# Patient Record
Sex: Female | Born: 1980 | Hispanic: Yes | Marital: Married | Smoking: Never smoker
Health system: Southern US, Community
[De-identification: ages and names within clinical notes are randomized; demographics above are authoritative.]

## PROBLEM LIST (undated history)

## (undated) ENCOUNTER — Inpatient Hospital Stay (HOSPITAL_COMMUNITY): Payer: Self-pay

## (undated) DIAGNOSIS — F410 Panic disorder [episodic paroxysmal anxiety] without agoraphobia: Secondary | ICD-10-CM

## (undated) DIAGNOSIS — F329 Major depressive disorder, single episode, unspecified: Secondary | ICD-10-CM

## (undated) DIAGNOSIS — F419 Anxiety disorder, unspecified: Secondary | ICD-10-CM

## (undated) DIAGNOSIS — I8390 Asymptomatic varicose veins of unspecified lower extremity: Secondary | ICD-10-CM

## (undated) DIAGNOSIS — F32A Depression, unspecified: Secondary | ICD-10-CM

## (undated) HISTORY — DX: Panic disorder (episodic paroxysmal anxiety): F41.0

## (undated) HISTORY — DX: Anxiety disorder, unspecified: F41.9

## (undated) HISTORY — DX: Depression, unspecified: F32.A

## (undated) HISTORY — DX: Major depressive disorder, single episode, unspecified: F32.9

## (undated) HISTORY — DX: Asymptomatic varicose veins of unspecified lower extremity: I83.90

---

## 2005-01-07 ENCOUNTER — Ambulatory Visit: Payer: Self-pay | Admitting: Family Medicine

## 2005-01-12 ENCOUNTER — Encounter: Admission: RE | Admit: 2005-01-12 | Discharge: 2005-01-12 | Payer: Self-pay | Admitting: Family Medicine

## 2005-01-27 ENCOUNTER — Ambulatory Visit: Payer: Self-pay | Admitting: Family Medicine

## 2007-04-26 ENCOUNTER — Encounter (INDEPENDENT_AMBULATORY_CARE_PROVIDER_SITE_OTHER): Payer: Self-pay | Admitting: Internal Medicine

## 2007-04-26 ENCOUNTER — Ambulatory Visit: Payer: Self-pay | Admitting: Family Medicine

## 2007-04-26 ENCOUNTER — Ambulatory Visit: Payer: Self-pay | Admitting: *Deleted

## 2007-04-26 LAB — CONVERTED CEMR LAB
ALT: 11 units/L (ref 0–35)
AST: 19 units/L (ref 0–37)
Albumin: 4.6 g/dL (ref 3.5–5.2)
Alkaline Phosphatase: 50 units/L (ref 39–117)
BUN: 10 mg/dL (ref 6–23)
Basophils Absolute: 0 10*3/uL (ref 0.0–0.1)
Basophils Relative: 0 % (ref 0–1)
CO2: 26 meq/L (ref 19–32)
Calcium: 9.8 mg/dL (ref 8.4–10.5)
Chloride: 103 meq/L (ref 96–112)
Cholesterol: 159 mg/dL (ref 0–200)
Creatinine, Ser: 0.47 mg/dL (ref 0.40–1.20)
Eosinophils Absolute: 0 10*3/uL (ref 0.0–0.7)
Eosinophils Relative: 1 % (ref 0–5)
Glucose, Bld: 79 mg/dL (ref 70–99)
HCT: 38.8 % (ref 36.0–46.0)
HDL: 45 mg/dL (ref >39)
Helicobacter Pylori Antibody-IgG: 3.5 — ABNORMAL HIGH
Hemoglobin: 13 g/dL (ref 12.0–15.0)
LDL Cholesterol: 94 mg/dL (ref 0–99)
Lymphocytes Relative: 39 % (ref 12–46)
Lymphs Abs: 1.7 10*3/uL (ref 0.7–4.0)
MCHC: 33.5 g/dL (ref 30.0–36.0)
MCV: 92.8 fL (ref 78.0–100.0)
Monocytes Absolute: 0.3 10*3/uL (ref 0.1–1.0)
Monocytes Relative: 7 % (ref 3–12)
Neutro Abs: 2.3 10*3/uL (ref 1.7–7.7)
Neutrophils Relative %: 53 % (ref 43–77)
Platelets: 280 10*3/uL (ref 150–400)
Potassium: 4.4 meq/L (ref 3.5–5.3)
RBC: 4.18 M/uL (ref 3.87–5.11)
RDW: 12.6 % (ref 11.5–15.5)
Sodium: 140 meq/L (ref 135–145)
Total Bilirubin: 0.4 mg/dL (ref 0.3–1.2)
Total CHOL/HDL Ratio: 3.5
Total Protein: 7.6 g/dL (ref 6.0–8.3)
Triglycerides: 102 mg/dL (ref <150)
VLDL: 20 mg/dL (ref 0–40)
WBC: 4.3 10*3/uL (ref 4.0–10.5)

## 2007-06-08 ENCOUNTER — Encounter: Payer: Self-pay | Admitting: Family Medicine

## 2007-06-08 ENCOUNTER — Ambulatory Visit: Payer: Self-pay | Admitting: Internal Medicine

## 2007-06-08 LAB — CONVERTED CEMR LAB
Chlamydia, DNA Probe: NEGATIVE
GC Probe Amp, Genital: NEGATIVE

## 2007-09-05 ENCOUNTER — Ambulatory Visit: Payer: Self-pay | Admitting: Internal Medicine

## 2008-09-04 ENCOUNTER — Inpatient Hospital Stay (HOSPITAL_COMMUNITY): Admission: AD | Admit: 2008-09-04 | Discharge: 2008-09-04 | Payer: Self-pay | Admitting: Obstetrics & Gynecology

## 2008-09-06 ENCOUNTER — Inpatient Hospital Stay (HOSPITAL_COMMUNITY): Admission: AD | Admit: 2008-09-06 | Discharge: 2008-09-06 | Payer: Self-pay | Admitting: Obstetrics & Gynecology

## 2009-01-23 ENCOUNTER — Ambulatory Visit: Payer: Self-pay | Admitting: Internal Medicine

## 2009-05-29 ENCOUNTER — Ambulatory Visit (HOSPITAL_COMMUNITY): Admission: RE | Admit: 2009-05-29 | Discharge: 2009-05-29 | Payer: Self-pay | Admitting: Family Medicine

## 2009-10-26 ENCOUNTER — Inpatient Hospital Stay (HOSPITAL_COMMUNITY): Admission: AD | Admit: 2009-10-26 | Discharge: 2009-10-28 | Payer: Self-pay | Admitting: Family Medicine

## 2009-10-26 ENCOUNTER — Ambulatory Visit: Payer: Self-pay | Admitting: Family Medicine

## 2010-03-23 ENCOUNTER — Encounter: Payer: Self-pay | Admitting: Family Medicine

## 2010-05-16 LAB — CBC
HCT: 30.4 % — ABNORMAL LOW (ref 36.0–46.0)
HCT: 36.2 % (ref 36.0–46.0)
Hemoglobin: 10.7 g/dL — ABNORMAL LOW (ref 12.0–15.0)
Hemoglobin: 12.7 g/dL (ref 12.0–15.0)
MCH: 34.2 pg — ABNORMAL HIGH (ref 26.0–34.0)
MCH: 34.5 pg — ABNORMAL HIGH (ref 26.0–34.0)
MCHC: 35 g/dL (ref 30.0–36.0)
MCHC: 35.2 g/dL (ref 30.0–36.0)
MCV: 97.6 fL (ref 78.0–100.0)
MCV: 98.2 fL (ref 78.0–100.0)
Platelets: 184 10*3/uL (ref 150–400)
Platelets: 240 10*3/uL (ref 150–400)
RBC: 3.09 MIL/uL — ABNORMAL LOW (ref 3.87–5.11)
RBC: 3.71 MIL/uL — ABNORMAL LOW (ref 3.87–5.11)
RDW: 13 % (ref 11.5–15.5)
RDW: 13 % (ref 11.5–15.5)
WBC: 6.9 10*3/uL (ref 4.0–10.5)
WBC: 7 10*3/uL (ref 4.0–10.5)

## 2010-05-16 LAB — RPR: RPR Ser Ql: NONREACTIVE

## 2010-06-08 LAB — CBC
HCT: 37.4 % (ref 36.0–46.0)
Hemoglobin: 13.2 g/dL (ref 12.0–15.0)
MCHC: 35.4 g/dL (ref 30.0–36.0)
MCV: 92.9 fL (ref 78.0–100.0)
Platelets: 206 10*3/uL (ref 150–400)
RBC: 4.02 MIL/uL (ref 3.87–5.11)
RDW: 12.5 % (ref 11.5–15.5)
WBC: 4.9 10*3/uL (ref 4.0–10.5)

## 2010-06-08 LAB — URINALYSIS, ROUTINE W REFLEX MICROSCOPIC
Bilirubin Urine: NEGATIVE
Glucose, UA: NEGATIVE mg/dL
Ketones, ur: NEGATIVE mg/dL
Leukocytes, UA: NEGATIVE
Nitrite: NEGATIVE
Protein, ur: NEGATIVE mg/dL
Specific Gravity, Urine: 1.01 (ref 1.005–1.030)
Urobilinogen, UA: 0.2 mg/dL (ref 0.0–1.0)
pH: 5.5 (ref 5.0–8.0)

## 2010-06-08 LAB — URINE MICROSCOPIC-ADD ON

## 2010-06-08 LAB — ABO/RH: ABO/RH(D): A POS

## 2010-06-08 LAB — HCG, QUANTITATIVE, PREGNANCY
hCG, Beta Chain, Quant, S: 195 m[IU]/mL — ABNORMAL HIGH (ref <5)
hCG, Beta Chain, Quant, S: 45 m[IU]/mL — ABNORMAL HIGH (ref <5)

## 2010-06-08 LAB — POCT PREGNANCY, URINE: Preg Test, Ur: POSITIVE

## 2012-01-18 LAB — OB RESULTS CONSOLE ABO/RH

## 2012-01-18 LAB — OB RESULTS CONSOLE HIV ANTIBODY (ROUTINE TESTING): HIV: NONREACTIVE

## 2012-01-18 LAB — OB RESULTS CONSOLE GC/CHLAMYDIA
Chlamydia: NEGATIVE
Gonorrhea: NEGATIVE

## 2012-01-18 LAB — OB RESULTS CONSOLE ANTIBODY SCREEN: Antibody Screen: NEGATIVE

## 2012-01-18 LAB — OB RESULTS CONSOLE HEPATITIS B SURFACE ANTIGEN: Hepatitis B Surface Ag: NEGATIVE

## 2012-01-18 LAB — OB RESULTS CONSOLE RUBELLA ANTIBODY, IGM: Rubella: IMMUNE

## 2012-01-18 LAB — OB RESULTS CONSOLE RPR: RPR: NONREACTIVE

## 2012-01-19 ENCOUNTER — Ambulatory Visit (HOSPITAL_COMMUNITY)
Admission: RE | Admit: 2012-01-19 | Discharge: 2012-01-19 | Disposition: A | Discharge status: Home / Self Care | Payer: Medicaid Other | Source: Ambulatory Visit | Attending: Nurse Practitioner | Admitting: Nurse Practitioner

## 2012-01-19 ENCOUNTER — Other Ambulatory Visit (HOSPITAL_COMMUNITY): Payer: Self-pay | Admitting: Nurse Practitioner

## 2012-01-19 DIAGNOSIS — Z363 Encounter for antenatal screening for malformations: Secondary | ICD-10-CM | POA: Insufficient documentation

## 2012-01-19 DIAGNOSIS — IMO0002 Reserved for concepts with insufficient information to code with codable children: Secondary | ICD-10-CM

## 2012-01-19 DIAGNOSIS — Z1389 Encounter for screening for other disorder: Secondary | ICD-10-CM | POA: Insufficient documentation

## 2012-01-19 DIAGNOSIS — O3660X0 Maternal care for excessive fetal growth, unspecified trimester, not applicable or unspecified: Secondary | ICD-10-CM | POA: Insufficient documentation

## 2012-01-19 DIAGNOSIS — O358XX0 Maternal care for other (suspected) fetal abnormality and damage, not applicable or unspecified: Secondary | ICD-10-CM | POA: Insufficient documentation

## 2012-03-02 NOTE — L&D Delivery Note (Signed)
Ziana Heyliger is a 32 y.o. [phone] admitted at [redacted]w[redacted]d for IOL for post-dates. She had 1 prior c/section and 2 successful VBACs previously. She was 3 cm on arrival and was started on pitocin. She progressed rapidly to complete dilation.   Delivery Note At 2:42 PM a viable female was delivered via Vaginal, Spontaneous Delivery (Presentation: vertex, OA).  APGAR: 9, 9; weight 8 lb, 8 oz. Placenta status: spontaneous, intact.  Cord: 3-vessel. Complications: none.  Cord pH: n/a  Anesthesia: Epidural  Episiotomy: none Lacerations: none Suture Repair: n/a Est. Blood Loss (mL): 200  Mom to postpartum.  Baby to nursery-stable.  Napoleon Form 06/19/2012, 2:53 PM

## 2012-05-13 LAB — OB RESULTS CONSOLE GBS: GBS: NEGATIVE

## 2012-06-13 ENCOUNTER — Other Ambulatory Visit (HOSPITAL_COMMUNITY): Payer: Self-pay | Admitting: Physician Assistant

## 2012-06-13 DIAGNOSIS — O480 Post-term pregnancy: Secondary | ICD-10-CM

## 2012-06-14 ENCOUNTER — Telehealth (HOSPITAL_COMMUNITY): Payer: Self-pay | Admitting: *Deleted

## 2012-06-14 ENCOUNTER — Encounter (HOSPITAL_COMMUNITY): Payer: Self-pay | Admitting: *Deleted

## 2012-06-14 NOTE — Telephone Encounter (Signed)
Preadmission screen  

## 2012-06-16 ENCOUNTER — Inpatient Hospital Stay (HOSPITAL_COMMUNITY)
Admission: AD | Admit: 2012-06-16 | Discharge: 2012-06-16 | Disposition: A | Discharge status: Home / Self Care | Payer: Self-pay | Source: Ambulatory Visit | Attending: Obstetrics & Gynecology | Admitting: Obstetrics & Gynecology

## 2012-06-16 ENCOUNTER — Ambulatory Visit (HOSPITAL_COMMUNITY)
Admission: RE | Admit: 2012-06-16 | Discharge: 2012-06-16 | Disposition: A | Discharge status: Home / Self Care | Payer: MEDICAID | Source: Ambulatory Visit | Attending: Physician Assistant | Admitting: Physician Assistant

## 2012-06-16 ENCOUNTER — Encounter (HOSPITAL_COMMUNITY): Payer: Self-pay | Admitting: *Deleted

## 2012-06-16 DIAGNOSIS — Z3689 Encounter for other specified antenatal screening: Secondary | ICD-10-CM | POA: Insufficient documentation

## 2012-06-16 DIAGNOSIS — O480 Post-term pregnancy: Secondary | ICD-10-CM

## 2012-06-16 DIAGNOSIS — O34219 Maternal care for unspecified type scar from previous cesarean delivery: Secondary | ICD-10-CM | POA: Insufficient documentation

## 2012-06-16 DIAGNOSIS — O479 False labor, unspecified: Secondary | ICD-10-CM

## 2012-06-16 LAB — AMNISURE RUPTURE OF MEMBRANE (ROM) NOT AT ARMC: Amnisure ROM: NEGATIVE

## 2012-06-16 NOTE — MAU Note (Signed)
C/o ucs yesterday and this morning;

## 2012-06-16 NOTE — MAU Note (Signed)
Scheduled for induction for this Sunday;

## 2012-06-16 NOTE — MAU Note (Signed)
Contractions past 2 wks, getting stronger, now every 30 min.  Lots of pressure. ? Water since Monday.

## 2012-06-16 NOTE — MAU Provider Note (Signed)
History     CSN: [id number]  Arrival date and time: 06/16/12 4098   None     Chief Complaint  Patient presents with  . Labor Eval   HPI Jackie Cooper  Is a 32 y.o. [phone] who presents at [redacted]w[redacted]d  with ctx and she states that she has had some light yellowish-brown leakage of fluid, no bleeding. Pt states she has been contracting every 30 mins or so. Pt has had a past c/s in 01/1999 in [place] which was her first delivery. Hx of 2 successful vbac deliveries, reports no complications. Last delivery in 2011. Pt states she [facility] for [facility] . Baby moving well.  Prenatal care at [facility]. Uncomplicated pregnancy. Induction scheduled for Sunday 06/19/12.   OB History   Grav Para Term Preterm Abortions TAB SAB Ect Mult Living   5 3 3  1  1   3       Past Medical History  Diagnosis Date  . Varicose veins   . Depression     pp depression    Past Surgical History  Procedure Laterality Date  . Cesarean section      Family History  Problem Relation Age of Onset  . Diabetes Maternal Grandmother     History  Substance Use Topics  . Smoking status: Never Smoker   . Smokeless tobacco: Never Used  . Alcohol Use: No    Allergies: No Known Allergies  No prescriptions prior to admission    Review of Systems  Constitutional: Negative for fever and chills.  HENT: Negative.   Eyes: Negative for blurred vision and double vision.  Respiratory: Negative.   Cardiovascular: Negative.   Gastrointestinal: Negative for nausea, vomiting, diarrhea and constipation.  Genitourinary: Positive for dysuria, urgency and frequency. Negative for hematuria.  Musculoskeletal: Negative for myalgias.  Neurological: Negative for dizziness and headaches.   Physical Exam   Blood pressure 113/69, pulse 74, temperature 98.3 F (36.8 C), temperature source Oral, resp. rate 18, height 5' 2.5" (1.588 m), weight 78.472 kg (173 lb).  Physical Exam   Constitutional: She is oriented to person, place, and time. She appears well-developed and well-nourished.  HENT:  Head: Normocephalic and atraumatic.  Eyes: EOM are normal.  Neck: Normal range of motion.  Cardiovascular: Normal rate, regular rhythm, normal heart sounds and intact distal pulses.   Respiratory: Effort normal and breath sounds normal. No respiratory distress. She has no wheezes. She exhibits no tenderness.  GI: Soft. She exhibits no distension and no mass. There is no tenderness. There is no rebound and no guarding.  Neurological: She is alert and oriented to person, place, and time.  Skin: Skin is warm and dry.   FHTs:  130, moderate variability, accels present, no decels. Reactive TOCO:  Occasional ctx   Results for orders placed during the hospital encounter of 06/16/12 (from the past 24 hour(s))  AMNISURE RUPTURE OF MEMBRANE (ROM)     Status: None   Collection Time    06/16/12 10:40 AM      Result Value Range   Amnisure ROM NEGATIVE       MAU Course  Procedures  MDM Slide for ferning - negative, cervical exam dilated 2cm and 60%, cervical membranes stripped   Assessment and Plan  A: Patient not in active labor, membranes stripped. Labor Check 2cm 50% effaced -3 station. Negative fern. Amnisure negative.  Reassuring NST  P: discharge home. Return if contractions worsen, if there is leakage or gush  of fluid, or bleeding. Scheduled induction for Sunday 06/19/12  Rosalee Kaufman 06/16/2012, 9:15 AM   I saw and examined patient and agree with above student note. I reviewed history, imaging, labs, and vitals. I personally reviewed the fetal heart tracing, and it is reactive. Napoleon Form, MD

## 2012-06-18 NOTE — MAU Provider Note (Signed)
Attestation of Attending Supervision of Obstetric Fellow: Evaluation and management procedures were performed by the Obstetric Fellow under my supervision and collaboration.  I have reviewed the Obstetric Fellow's note and chart, and I agree with the management and plan.  Kennethia Lynes, MD, FACOG Attending Obstetrician & Gynecologist Faculty Practice, [facility]   

## 2012-06-19 ENCOUNTER — Inpatient Hospital Stay (HOSPITAL_COMMUNITY)
Admission: RE | Admit: 2012-06-19 | Discharge: 2012-06-20 | DRG: 775 | Disposition: A | Discharge status: Home / Self Care | Payer: Medicaid Other | Source: Ambulatory Visit | Attending: Family Medicine | Admitting: Family Medicine

## 2012-06-19 ENCOUNTER — Encounter (HOSPITAL_COMMUNITY): Payer: Self-pay | Admitting: Anesthesiology

## 2012-06-19 ENCOUNTER — Encounter (HOSPITAL_COMMUNITY): Payer: Self-pay

## 2012-06-19 ENCOUNTER — Inpatient Hospital Stay (HOSPITAL_COMMUNITY): Payer: Medicaid Other | Admitting: Anesthesiology

## 2012-06-19 VITALS — BP 86/52 | HR 78 | Temp 98.2°F | Resp 16 | Ht 62.0 in | Wt 173.0 lb

## 2012-06-19 DIAGNOSIS — Z3490 Encounter for supervision of normal pregnancy, unspecified, unspecified trimester: Secondary | ICD-10-CM

## 2012-06-19 DIAGNOSIS — Z3493 Encounter for supervision of normal pregnancy, unspecified, third trimester: Secondary | ICD-10-CM

## 2012-06-19 DIAGNOSIS — O34219 Maternal care for unspecified type scar from previous cesarean delivery: Secondary | ICD-10-CM

## 2012-06-19 DIAGNOSIS — O480 Post-term pregnancy: Principal | ICD-10-CM | POA: Diagnosis present

## 2012-06-19 LAB — CBC
Hemoglobin: 12.4 g/dL (ref 12.0–15.0)
MCHC: 34.7 g/dL (ref 30.0–36.0)
Platelets: 214 10*3/uL (ref 150–400)
RBC: 3.74 MIL/uL — ABNORMAL LOW (ref 3.87–5.11)

## 2012-06-19 LAB — TYPE AND SCREEN: ABO/RH(D): A POS

## 2012-06-19 LAB — RPR: RPR Ser Ql: NONREACTIVE

## 2012-06-19 LAB — ABO/RH: ABO/RH(D): A POS

## 2012-06-19 MED ORDER — LIDOCAINE HCL (PF) 1 % IJ SOLN
30.0000 mL | INTRAMUSCULAR | Status: DC | PRN
  Filled 2012-06-19 (×2): qty 30

## 2012-06-19 MED ORDER — MEASLES, MUMPS & RUBELLA VAC ~~LOC~~ INJ
0.5000 mL | INJECTION | Freq: Once | SUBCUTANEOUS | Status: DC
  Filled 2012-06-19: qty 0.5

## 2012-06-19 MED ORDER — FENTANYL 2.5 MCG/ML BUPIVACAINE 1/10 % EPIDURAL INFUSION (WH - ANES)
14.0000 mL/h | INTRAMUSCULAR | Status: DC | PRN
  Filled 2012-06-19: qty 125

## 2012-06-19 MED ORDER — LACTATED RINGERS IV SOLN
500.0000 mL | INTRAVENOUS | Status: DC | PRN
  Administered 2012-06-19: 1000 mL via INTRAVENOUS

## 2012-06-19 MED ORDER — ONDANSETRON HCL 4 MG/2ML IJ SOLN: 4.0000 mg | Freq: Four times a day (QID) | INTRAMUSCULAR | Status: DC | PRN

## 2012-06-19 MED ORDER — OXYCODONE-ACETAMINOPHEN 5-325 MG PO TABS: 1.0000 | ORAL_TABLET | ORAL | Status: DC | PRN

## 2012-06-19 MED ORDER — ACETAMINOPHEN 325 MG PO TABS: 650.0000 mg | ORAL_TABLET | ORAL | Status: DC | PRN

## 2012-06-19 MED ORDER — FERROUS SULFATE 325 (65 FE) MG PO TABS
325.0000 mg | ORAL_TABLET | Freq: Two times a day (BID) | ORAL | Status: DC
  Administered 2012-06-20: 325 mg via ORAL
  Filled 2012-06-19: qty 1

## 2012-06-19 MED ORDER — DIBUCAINE 1 % RE OINT: 1.0000 "application " | TOPICAL_OINTMENT | RECTAL | Status: DC | PRN

## 2012-06-19 MED ORDER — OXYTOCIN 40 UNITS IN LACTATED RINGERS INFUSION - SIMPLE MED: 62.5000 mL/h | INTRAVENOUS | Status: DC

## 2012-06-19 MED ORDER — LACTATED RINGERS IV SOLN: 500.0000 mL | Freq: Once | INTRAVENOUS | Status: DC

## 2012-06-19 MED ORDER — DIPHENHYDRAMINE HCL 25 MG PO CAPS: 25.0000 mg | ORAL_CAPSULE | Freq: Four times a day (QID) | ORAL | Status: DC | PRN

## 2012-06-19 MED ORDER — SENNOSIDES-DOCUSATE SODIUM 8.6-50 MG PO TABS
2.0000 | ORAL_TABLET | Freq: Every day | ORAL | Status: DC
  Administered 2012-06-19: 2 via ORAL

## 2012-06-19 MED ORDER — LIDOCAINE HCL (PF) 1 % IJ SOLN
INTRAMUSCULAR | Status: DC | PRN
  Administered 2012-06-19 (×2): 5 mL

## 2012-06-19 MED ORDER — BISACODYL 10 MG RE SUPP: 10.0000 mg | Freq: Every day | RECTAL | Status: DC | PRN

## 2012-06-19 MED ORDER — OXYTOCIN 40 UNITS IN LACTATED RINGERS INFUSION - SIMPLE MED
INTRAVENOUS | Status: AC
  Filled 2012-06-19: qty 1000

## 2012-06-19 MED ORDER — TERBUTALINE SULFATE 1 MG/ML IJ SOLN: 0.2500 mg | Freq: Once | INTRAMUSCULAR | Status: DC | PRN

## 2012-06-19 MED ORDER — ONDANSETRON HCL 4 MG/2ML IJ SOLN: 4.0000 mg | INTRAMUSCULAR | Status: DC | PRN

## 2012-06-19 MED ORDER — IBUPROFEN 600 MG PO TABS
600.0000 mg | ORAL_TABLET | Freq: Four times a day (QID) | ORAL | Status: DC | PRN
  Administered 2012-06-19: 600 mg via ORAL
  Filled 2012-06-19: qty 1

## 2012-06-19 MED ORDER — WITCH HAZEL-GLYCERIN EX PADS: 1.0000 "application " | MEDICATED_PAD | CUTANEOUS | Status: DC | PRN

## 2012-06-19 MED ORDER — EPHEDRINE 5 MG/ML INJ
10.0000 mg | INTRAVENOUS | Status: DC | PRN
  Filled 2012-06-19: qty 4
  Filled 2012-06-19: qty 2

## 2012-06-19 MED ORDER — FLEET ENEMA 7-19 GM/118ML RE ENEM: 1.0000 | ENEMA | Freq: Every day | RECTAL | Status: DC | PRN

## 2012-06-19 MED ORDER — SIMETHICONE 80 MG PO CHEW: 80.0000 mg | CHEWABLE_TABLET | ORAL | Status: DC | PRN

## 2012-06-19 MED ORDER — BENZOCAINE-MENTHOL 20-0.5 % EX AERO: 1.0000 "application " | INHALATION_SPRAY | CUTANEOUS | Status: DC | PRN

## 2012-06-19 MED ORDER — OXYCODONE-ACETAMINOPHEN 5-325 MG PO TABS
1.0000 | ORAL_TABLET | ORAL | Status: DC | PRN
  Administered 2012-06-19 – 2012-06-20 (×4): 1 via ORAL
  Filled 2012-06-19 (×4): qty 1

## 2012-06-19 MED ORDER — PHENYLEPHRINE 40 MCG/ML (10ML) SYRINGE FOR IV PUSH (FOR BLOOD PRESSURE SUPPORT)
80.0000 ug | PREFILLED_SYRINGE | INTRAVENOUS | Status: DC | PRN
  Filled 2012-06-19: qty 5
  Filled 2012-06-19: qty 2

## 2012-06-19 MED ORDER — DIPHENHYDRAMINE HCL 50 MG/ML IJ SOLN: 12.5000 mg | INTRAMUSCULAR | Status: DC | PRN

## 2012-06-19 MED ORDER — IBUPROFEN 600 MG PO TABS
600.0000 mg | ORAL_TABLET | Freq: Four times a day (QID) | ORAL | Status: DC
  Administered 2012-06-20 (×3): 600 mg via ORAL
  Filled 2012-06-19 (×3): qty 1

## 2012-06-19 MED ORDER — FENTANYL 2.5 MCG/ML BUPIVACAINE 1/10 % EPIDURAL INFUSION (WH - ANES)
INTRAMUSCULAR | Status: DC | PRN
  Administered 2012-06-19: 14 mL/h via EPIDURAL

## 2012-06-19 MED ORDER — EPHEDRINE 5 MG/ML INJ
10.0000 mg | INTRAVENOUS | Status: DC | PRN
  Filled 2012-06-19: qty 2

## 2012-06-19 MED ORDER — OXYTOCIN 40 UNITS IN LACTATED RINGERS INFUSION - SIMPLE MED
1.0000 m[IU]/min | INTRAVENOUS | Status: DC
  Administered 2012-06-19: 2 m[IU]/min via INTRAVENOUS

## 2012-06-19 MED ORDER — OXYTOCIN BOLUS FROM INFUSION: 500.0000 mL | INTRAVENOUS | Status: DC

## 2012-06-19 MED ORDER — TETANUS-DIPHTH-ACELL PERTUSSIS 5-2.5-18.5 LF-MCG/0.5 IM SUSP: 0.5000 mL | Freq: Once | INTRAMUSCULAR | Status: DC

## 2012-06-19 MED ORDER — LANOLIN HYDROUS EX OINT: TOPICAL_OINTMENT | CUTANEOUS | Status: DC | PRN

## 2012-06-19 MED ORDER — OXYTOCIN 40 UNITS IN LACTATED RINGERS INFUSION - SIMPLE MED: 62.5000 mL/h | INTRAVENOUS | Status: DC | PRN

## 2012-06-19 MED ORDER — FLEET ENEMA 7-19 GM/118ML RE ENEM: 1.0000 | ENEMA | RECTAL | Status: DC | PRN

## 2012-06-19 MED ORDER — LACTATED RINGERS IV SOLN
INTRAVENOUS | Status: DC
  Administered 2012-06-19: 1000 mL via INTRAVENOUS

## 2012-06-19 MED ORDER — ONDANSETRON HCL 4 MG PO TABS: 4.0000 mg | ORAL_TABLET | ORAL | Status: DC | PRN

## 2012-06-19 MED ORDER — PRENATAL MULTIVITAMIN CH
1.0000 | ORAL_TABLET | Freq: Every day | ORAL | Status: DC
  Administered 2012-06-20: 1 via ORAL
  Filled 2012-06-19: qty 1

## 2012-06-19 MED ORDER — CITRIC ACID-SODIUM CITRATE 334-500 MG/5ML PO SOLN: 30.0000 mL | ORAL | Status: DC | PRN

## 2012-06-19 MED ORDER — PHENYLEPHRINE 40 MCG/ML (10ML) SYRINGE FOR IV PUSH (FOR BLOOD PRESSURE SUPPORT)
80.0000 ug | PREFILLED_SYRINGE | INTRAVENOUS | Status: DC | PRN
  Filled 2012-06-19: qty 2

## 2012-06-19 MED ORDER — ZOLPIDEM TARTRATE 5 MG PO TABS: 5.0000 mg | ORAL_TABLET | Freq: Every evening | ORAL | Status: DC | PRN

## 2012-06-19 NOTE — Anesthesia Procedure Notes (Signed)
Epidural Patient location during procedure: OB  Staffing Anesthesiologist: Sherman Donaldson Performed by: anesthesiologist   Preanesthetic Checklist Completed: patient identified, site marked, surgical consent, pre-op evaluation, timeout performed, IV checked, risks and benefits discussed and monitors and equipment checked  Epidural Patient position: sitting Prep: ChloraPrep Patient monitoring: heart rate, continuous pulse ox and blood pressure Approach: midline Injection technique: LOR saline  Needle:  Needle type: Tuohy  Needle gauge: 17 G Needle length: 9 cm and 9 Needle insertion depth: 7 cm Catheter type: closed end flexible Catheter size: 20 Guage Catheter at skin depth: 12 cm Test dose: negative  Assessment Events: blood not aspirated, injection not painful, no injection resistance, negative IV test and no paresthesia  Additional Notes   Patient tolerated the insertion well without complications.   

## 2012-06-19 NOTE — Progress Notes (Signed)
Patient ID: Jackie Cooper, female   DOB: 06/06/1980, 32 y.o.   MRN: [id number]  S: Pt somewhat uncomfortable.  OCeasar Mons Vitals:   06/19/12 0918 06/19/12 1003 06/19/12 1101 06/19/12 1155  BP: 111/62 108/65  118/65  Pulse: 83 72  61  Temp:   97.9 F (36.6 C)   TempSrc:   Oral   Resp:   18   Height:      Weight:        Cervix: 3.5/80/-2  FHTs:  Was 135 with moderate-marked variability and accels. Had decels x 1-2 minutes to 60s with poor tracing when on left and right side. Now baseline 150, moderate variability, variable decel to 100s for 1 min while being checked, accels present  FSE placed, no fluid noted.  TOCO: q 3-4 minutes  A/P 32 y.o. [id number] at [redacted]w[redacted]d here for IOL for post-dates. - small change in dilation - FSE placed, FHT cat II but overall reassuring - Continue pitocin - Anticipate successful VBAC  Napoleon Form, MD

## 2012-06-19 NOTE — Anesthesia Preprocedure Evaluation (Signed)

## 2012-06-19 NOTE — H&P (Signed)
Jackie Cooper is a 32 y.o. female presenting for IOL for post-dates. Patient has been contracting some at home, mostly mild, no ROM or vaginal bleeding. Baby moving well.  Receives care at [facility]. No complications with this pregnancy. Had c-section with first baby in [place], not sure why. Has had two successful VBACs with no complications. Plans to breastfeed and get Mirena IUD for contraception.  Maternal Medical History:  Reason for admission: Nausea.    OB History   Grav Para Term Preterm Abortions TAB SAB Ect Mult Living   5 3 3  1  1   3      Past Medical History  Diagnosis Date  . Varicose veins   . Depression     pp depression   Past Surgical History  Procedure Laterality Date  . Cesarean section     Family History: family history includes Diabetes in her maternal grandmother. Social History:  reports that she has never smoked. She has never used smokeless tobacco. She reports that she does not drink alcohol or use illicit drugs.   Prenatal Transfer Tool  Maternal Diabetes: No Genetic Screening: Normal Maternal Ultrasounds/Referrals: Normal Fetal Ultrasounds or other Referrals:  None Maternal Substance Abuse:  No Significant Maternal Medications:  None Significant Maternal Lab Results:  Lab values include: Group B Strep negative Other Comments:  None  Review of Systems  Constitutional: Negative for fever and chills.  Eyes: Negative for blurred vision and double vision.  Gastrointestinal: Negative for nausea, vomiting and diarrhea.  Neurological: Negative for dizziness and headaches.    Dilation: 3 Effacement (%): 80 Station: 0;-1 Exam by:: Dherr rn Blood pressure 111/62, pulse 83, temperature 97.8 F (36.6 C), temperature source Oral, resp. rate 18, height 5\' 2"  (1.575 m), weight 78.472 kg (173 lb). Maternal Exam:  Uterine Assessment: Contraction strength is mild.  Contraction frequency is irregular.   Abdomen: Surgical  scars: low vertical midline.   Fetal presentation: vertex  Pelvis: adequate for delivery.   Cervix: Cervix evaluated by digital exam.     Fetal Exam Fetal Monitor Review: Mode: ultrasound.   Baseline rate: 135.  Variability: moderate (6-25 bpm).   Pattern: accelerations present and no decelerations.    Fetal State Assessment: Category I - tracings are normal.     Physical Exam  Constitutional: She is oriented to person, place, and time. She appears well-developed and well-nourished. No distress.  HENT:  Head: Normocephalic and atraumatic.  Eyes: Conjunctivae and EOM are normal.  Neck: Normal range of motion. Neck supple.  Cardiovascular: Normal rate.   Respiratory: Effort normal. No respiratory distress.  GI: Soft. There is no tenderness. There is no rebound and no guarding.  Musculoskeletal: Normal range of motion. She exhibits no edema and no tenderness.  Neurological: She is alert and oriented to person, place, and time.  Skin: Skin is warm and dry.  Psychiatric: She has a normal mood and affect.    Filed Vitals:   06/19/12 0829 06/19/12 0847 06/19/12 0918  BP: 119/70  111/62  Pulse: 72  83  Temp: 97.8 F (36.6 C)    TempSrc: Oral    Resp: 18    Height:  5\' 2"  (1.575 m)   Weight:  78.472 kg (173 lb)      Dilation: 3 Effacement (%): 80 Cervical Position: Middle Station: 0;-1 Presentation: Vertex Exam by:: Dherr rn   Prenatal labs: ABO, Rh: A/Positive/-- (11/18 0000) Antibody: Negative (11/18 0000) Rubella: Immune (11/18 0000) RPR: Nonreactive (11/18 0000)  HBsAg: Negative (11/18 0000)  HIV: Non-reactive (11/18 0000)  GBS: Negative (03/14 0000)   Assessment/Plan: 32 y.o. [id number] at [redacted]w[redacted]d here for IOL for post-dates - FHTs category I - Prior c-section, 2 prior VBACs - Pitocin, GBS negative - Epidural when ready - Anticipate SVD  Napoleon Form 06/19/2012, 10:30 AM

## 2012-06-20 ENCOUNTER — Encounter: Payer: Self-pay | Admitting: Sports Medicine

## 2012-06-20 ENCOUNTER — Encounter (HOSPITAL_COMMUNITY): Payer: Self-pay

## 2012-06-20 DIAGNOSIS — Z3490 Encounter for supervision of normal pregnancy, unspecified, unspecified trimester: Secondary | ICD-10-CM

## 2012-06-20 DIAGNOSIS — O34219 Maternal care for unspecified type scar from previous cesarean delivery: Secondary | ICD-10-CM

## 2012-06-20 MED ORDER — OXYCODONE-ACETAMINOPHEN 5-325 MG PO TABS: 1.0000 | ORAL_TABLET | Freq: Four times a day (QID) | ORAL | Status: DC | PRN

## 2012-06-20 MED ORDER — IBUPROFEN 600 MG PO TABS: 600.0000 mg | ORAL_TABLET | Freq: Four times a day (QID) | ORAL | Status: DC | PRN

## 2012-06-20 NOTE — Progress Notes (Signed)
Ur chart review completed.  

## 2012-06-20 NOTE — Progress Notes (Signed)
I saw and examined patient and agree with above resident note. I reviewed history, delivery summary, labs and vitals. Napoleon Form, MD

## 2012-06-20 NOTE — Discharge Summary (Signed)
Obstetric Discharge Summary Jackie Cooper is a 32 y.o. [id number] presenting at [redacted]w[redacted]d for IOL for post-dates. She had a successful VBAC with no complication and unremarkable postpartum course. She is breast and bottle feeding and plans Mirena for contraception.  Reason for Admission: induction of labor Prenatal Procedures: none Intrapartum Procedures: spontaneous vaginal delivery, VBAC Postpartum Procedures: none Complications-Operative and Postpartum: none Hemoglobin  Date Value Range Status  06/19/2012 12.4  12.0 - 15.0 g/dL Final     HCT  Date Value Range Status  06/19/2012 35.7* 36.0 - 46.0 % Final   Hospital Course: Jackie Cooper is a 32 y.o. [id number] admitted at [redacted]w[redacted]d for IOL for post-dates. She had 1 prior c/section and 2 successful VBACs previously. She was 3 cm on arrival and was started on pitocin. She progressed rapidly to complete dilation and had a normal vaginal delivery and uneventful postpartum course.  Physical Exam:  General: alert, cooperative and no distress Lochia: appropriate Uterine Fundus: firm Incision: n/a DVT Evaluation: No evidence of DVT seen on physical exam. Negative Homan's sign. No cords or calf tenderness.  Discharge Diagnoses: Term Pregnancy-delivered  Discharge Information: Date: 06/20/2012 Activity: pelvic rest Diet: routine Medications: PNV, Ibuprofen and tylenol Condition: stable Instructions: refer to practice specific booklet Discharge to: home  Follow-up Information   Follow up with [facility] [place]. Schedule an appointment as soon as possible for a visit in 6 weeks.   Contact information:   [address] [place] [address] [phone]      Newborn Data: Live born female  Birth Weight: 8 lb 8 oz (3855 g) APGAR: 9,   Home with mother.  Levert Feinstein 06/20/2012, 7:57 AM  I saw and examined patient and agree with above resident note. I reviewed history, delivery  summary, labs and vitals. Napoleon Form, MD

## 2012-06-20 NOTE — Discharge Summary (Signed)
Attestation of Attending Supervision of Advanced Practitioner (CNM/NP): Evaluation and management procedures were performed by the Advanced Practitioner under my supervision and collaboration.  I have reviewed the Advanced Practitioner's note and chart, and I agree with the management and plan.  Khylee Algeo 06/20/2012 7:36 PM   

## 2012-06-20 NOTE — Anesthesia Postprocedure Evaluation (Signed)
  Anesthesia Post-op Note  Patient: Jackie Cooper  Procedure(s) Performed: * No procedures listed *  Patient Location: PACU and Mother/Baby  Anesthesia Type:Epidural  Level of Consciousness: awake, alert , oriented and patient cooperative  Airway and Oxygen Therapy: Patient Spontanous Breathing  Post-op Pain: mild  Post-op Assessment: Patient's Cardiovascular Status Stable and Respiratory Function Stable  Post-op Vital Signs: stable  Complications: No apparent anesthesia complications

## 2012-06-20 NOTE — Clinical Social Work Maternal (Signed)
    Clinical Social Work Department PSYCHOSOCIAL ASSESSMENT - MATERNAL/CHILD 06/20/2012  Patient:  Jackie Cooper, Jackie Cooper  Account Number:  [id number]  Admit Date:  06/19/2012  Marjo Bicker Name:   Jackie Cooper    Clinical Social Worker:  Nobie Putnam, LCSW   Date/Time:  06/20/2012 12:41 PM  Date Referred:  06/20/2012   Referral source  CN     Referred reason  Behavioral Health Issues   Other referral source:    I:  FAMILY / HOME ENVIRONMENT Child's legal guardian:  PARENT  Guardian - Name Guardian - Age Guardian - Address  Jackie Cooper [address] [place], [place] [address]  Jackie Cooper 35 (same as above)   Other household support members/support persons Name Relationship DOB   DAUGHTER 01/14/99   SON 11/06/00   DAUGHTER 10/26/09   Other support:    II  PSYCHOSOCIAL DATA Information Source:  Patient Interview  Event organiser Employment:   Financial resources:  Self Pay If Medicaid - County:   Other  WIC   School / Grade:   Maternity Care Coordinator / Child Services Coordination / Early Interventions:  Cultural issues impacting care:    III  STRENGTHS Strengths  Adequate Resources  Home prepared for Child (including basic supplies)  Supportive family/friends   Strength comment:    IV  RISK FACTORS AND CURRENT PROBLEMS Current Problem:  YES   Risk Factor & Current Problem Patient Issue Family Issue Risk Factor / Current Problem Comment  Mental Illness Y N Hx of PP depression    V  SOCIAL WORK ASSESSMENT CSW met with pt to discuss PP depression symptoms she experienced in 2002.  Pt explained that she had recently [place] to the [place] & was left alone a lot during that time.  Pt told CSW that she avoided going into her kitchen because she envisioned herself getting a knife and cutting herself.  Pt never discussed symptoms with anyone because she didn't want anyone to think she was "crazy."  Her  symptoms lasted for about 1 month before they resolved.  She denies any history of HI.  She only experienced PP depression after the birth of her 2nd child & thinks it was influenced by her recent move to the country.  She denies any depression symptoms since then.  Her spouse is supportive, as per pt.  She agrees to seek medical attention if PP depression symptoms arise again.  Pt was very pleasant & spoke openly with this CSW. She has all the necessary supplies & appears appropriate at this time.      VI SOCIAL WORK PLAN Social Work Plan  No Further Intervention Required / No Barriers to Discharge   Type of pt/family education:   If child protective services report - county:   If child protective services report - date:   Information/referral to community resources comment:   Other social work plan:

## 2012-06-20 NOTE — Progress Notes (Addendum)
Post Partum Day 1 Subjective: up ad lib, voiding, tolerating PO and + flatus  Objective: Blood pressure 86/52, pulse 78, temperature 98.2 F (36.8 C), temperature source Oral, resp. rate 16, height 5\' 2"  (1.575 m), weight 173 lb (78.472 kg), SpO2 98.00%.  Physical Exam:  General: alert, cooperative and no distress Lochia: appropriate Uterine Fundus: firm Incision: n/a DVT Evaluation: No evidence of DVT seen on physical exam. Negative Homan's sign. No cords or calf tenderness.   Recent Labs  06/19/12 0830  HGB 12.4  HCT 35.7*    Assessment/Plan: Plan for discharge tomorrow Breastfeeding & bottle feeding Plans for mirena for contraception   LOS: 1 day   Levert Feinstein 06/20/2012, 7:43 AM

## 2012-06-26 ENCOUNTER — Encounter (HOSPITAL_COMMUNITY): Payer: Self-pay | Admitting: Advanced Practice Midwife

## 2012-06-26 ENCOUNTER — Inpatient Hospital Stay (HOSPITAL_COMMUNITY)
Admission: AD | Admit: 2012-06-26 | Discharge: 2012-06-26 | Disposition: A | Discharge status: Home / Self Care | Payer: MEDICAID | Source: Ambulatory Visit | Attending: Obstetrics & Gynecology | Admitting: Obstetrics & Gynecology

## 2012-06-26 DIAGNOSIS — N390 Urinary tract infection, site not specified: Secondary | ICD-10-CM | POA: Insufficient documentation

## 2012-06-26 DIAGNOSIS — O2390 Unspecified genitourinary tract infection in pregnancy, unspecified trimester: Secondary | ICD-10-CM | POA: Insufficient documentation

## 2012-06-26 DIAGNOSIS — O864 Pyrexia of unknown origin following delivery: Secondary | ICD-10-CM | POA: Insufficient documentation

## 2012-06-26 LAB — URINALYSIS, ROUTINE W REFLEX MICROSCOPIC
Bilirubin Urine: NEGATIVE
Ketones, ur: NEGATIVE mg/dL
Nitrite: POSITIVE — AB
Protein, ur: 30 mg/dL — AB
Urobilinogen, UA: 0.2 mg/dL (ref 0.0–1.0)
pH: 6.5 (ref 5.0–8.0)

## 2012-06-26 LAB — CBC
Hemoglobin: 12.1 g/dL (ref 12.0–15.0)
MCH: 33.1 pg (ref 26.0–34.0)
MCHC: 34.7 g/dL (ref 30.0–36.0)
Platelets: 204 10*3/uL (ref 150–400)
RDW: 12.1 % (ref 11.5–15.5)

## 2012-06-26 LAB — URINE MICROSCOPIC-ADD ON

## 2012-06-26 MED ORDER — SULFAMETHOXAZOLE-TMP DS 800-160 MG PO TABS: 1.0000 | ORAL_TABLET | Freq: Two times a day (BID) | ORAL | Status: DC

## 2012-06-26 MED ORDER — CEFTRIAXONE SODIUM 1 G IJ SOLR
1.0000 g | Freq: Once | INTRAMUSCULAR | Status: AC
  Administered 2012-06-26: 1 g via INTRAMUSCULAR
  Filled 2012-06-26: qty 10

## 2012-06-26 MED ORDER — IBUPROFEN 800 MG PO TABS
800.0000 mg | ORAL_TABLET | Freq: Once | ORAL | Status: AC
  Administered 2012-06-26: 800 mg via ORAL
  Filled 2012-06-26: qty 1

## 2012-06-26 NOTE — MAU Note (Signed)
Pt presents with complaints of fever since yesterday. Delivered vaginally on April the 20th.

## 2012-06-28 LAB — URINE CULTURE: Colony Count: >=100000

## 2012-06-30 ENCOUNTER — Encounter (HOSPITAL_COMMUNITY): Payer: Self-pay

## 2013-06-22 ENCOUNTER — Inpatient Hospital Stay (HOSPITAL_COMMUNITY)
Admission: AD | Admit: 2013-06-22 | Discharge: 2013-06-23 | Disposition: A | Discharge status: Home / Self Care | Payer: Medicaid Other | Source: Ambulatory Visit | Attending: Obstetrics & Gynecology | Admitting: Obstetrics & Gynecology

## 2013-06-22 DIAGNOSIS — K8020 Calculus of gallbladder without cholecystitis without obstruction: Secondary | ICD-10-CM | POA: Insufficient documentation

## 2013-06-22 DIAGNOSIS — R109 Unspecified abdominal pain: Secondary | ICD-10-CM | POA: Insufficient documentation

## 2013-06-22 LAB — URINALYSIS, ROUTINE W REFLEX MICROSCOPIC
BILIRUBIN URINE: NEGATIVE
Glucose, UA: NEGATIVE mg/dL
Ketones, ur: 15 mg/dL — AB
Leukocytes, UA: NEGATIVE
NITRITE: NEGATIVE
PH: 7.5 (ref 5.0–8.0)
Protein, ur: NEGATIVE mg/dL
SPECIFIC GRAVITY, URINE: 1.015 (ref 1.005–1.030)
Urobilinogen, UA: 1 mg/dL (ref 0.0–1.0)

## 2013-06-22 LAB — URINE MICROSCOPIC-ADD ON

## 2013-06-22 LAB — POCT PREGNANCY, URINE: Preg Test, Ur: NEGATIVE

## 2013-06-22 NOTE — MAU Note (Signed)
Pt reports pain in upper abd, feet and hands get numb when she has the pain. Pain also makes her feel like she can't breathe.

## 2013-06-22 NOTE — MAU Provider Note (Signed)
Chief Complaint: Abdominal Pain   First Provider Initiated Contact with Patient 06/23/13 0031      SUBJECTIVE HPI: Jackie Cooper is a 33 y.o. [phone] female who presents w/ a third episode of severe upper abd pain, nausea. Pain was so severe that it made it difficult to breathe. Developed numbness in hands feet and lips, resolved.  First two episodes occurred in pregnancy. Did not seek medical care. No Dx. Episode today 10/10 on pina scale. Improved w/ Ibuprofen and Pepcid.    Past Medical History  Diagnosis Date  . Varicose veins   . Depression     pp depression   OB History  Gravida Para Term Preterm AB SAB TAB Ectopic Multiple Living  5 4 4  1 1    4     # Outcome Date GA Lbr Len/2nd Weight Sex Delivery Anes PTL Lv  5 TRM 06/19/12 [redacted]w[redacted]d 05:10 / 00:12 3.856 kg (8 lb 8 oz) F VBAC EPI  Y  4 TRM 2011 [redacted]w[redacted]d 07:00 4.139 kg (9 lb 2 oz) F SVD EPI  Y  3 SAB 2010 [redacted]w[redacted]d         2 TRM 2002 [redacted]w[redacted]d 36:00 3.09 kg (6 lb 13 oz) M SVD EPI  Y     Comments: oligo pp dep no meds  1 TRM 2000 [redacted]w[redacted]d 03:00 3.742 kg (8 lb 4 oz) F LTCS   Y     Past Surgical History  Procedure Laterality Date  . Cesarean section     History   Social History  . Marital Status: Married    Spouse Name: N/A    Number of Children: N/A  . Years of Education: N/A   Occupational History  . Not on file.   Social History Main Topics  . Smoking status: Never Smoker   . Smokeless tobacco: Never Used  . Alcohol Use: No  . Drug Use: No  . Sexual Activity: Yes    Birth Control/ Protection: IUD   Other Topics Concern  . Not on file   Social History Narrative  . No narrative on file   No current facility-administered medications on file prior to encounter.   Current Outpatient Prescriptions on File Prior to Encounter  Medication Sig Dispense Refill  . ibuprofen (ADVIL,MOTRIN) 600 MG tablet Take 1 tablet (600 mg total) by mouth every 6 (six) hours as needed for pain.  30 tablet  0  .  sulfamethoxazole-trimethoprim (BACTRIM DS) 800-160 MG per tablet Take 1 tablet by mouth 2 (two) times daily.  14 tablet  0   No Known Allergies  ROS: Pertinent positive items in HPI. Neg for fever, chills, vomiting, diarrhea, low abd pain. Last BM today, Normal.  OBJECTIVE Blood pressure 110/64, pulse 56, temperature 97.4 F (36.3 C), temperature source Oral, resp. rate 16, height 5\' 2"  (1.575 m), weight 64.864 kg (143 lb), SpO2 100.00%, unknown if currently breastfeeding. GENERAL: Well-developed, well-nourished female in mild distress.  HEENT: Normocephalic HEART: normal rate RESP: normal effort ABDOMEN: Soft, mild upper abd tenderness. Normal BS x 4. Neg CVAT. EXTREMITIES: Nontender, no edema NEURO: Alert and oriented SPECULUM EXAM: Deferred  LAB RESULTS Results for orders placed during the hospital encounter of 06/22/13 (from the past 24 hour(s))  URINALYSIS, ROUTINE W REFLEX MICROSCOPIC     Status: Abnormal   Collection Time    06/22/13 10:55 PM      Result Value Ref Range   Color, Urine YELLOW  YELLOW   APPearance CLEAR  CLEAR  Specific Gravity, Urine 1.015  1.005 - 1.030   pH 7.5  5.0 - 8.0   Glucose, UA NEGATIVE  NEGATIVE mg/dL   Hgb urine dipstick TRACE (*) NEGATIVE   Bilirubin Urine NEGATIVE  NEGATIVE   Ketones, ur 15 (*) NEGATIVE mg/dL   Protein, ur NEGATIVE  NEGATIVE mg/dL   Urobilinogen, UA 1.0  0.0 - 1.0 mg/dL   Nitrite NEGATIVE  NEGATIVE   Leukocytes, UA NEGATIVE  NEGATIVE  URINE MICROSCOPIC-ADD ON     Status: None   Collection Time    06/22/13 10:55 PM      Result Value Ref Range   Squamous Epithelial / LPF RARE  RARE   WBC, UA 0-2  <3 WBC/hpf   RBC / HPF 0-2  <3 RBC/hpf   Bacteria, UA RARE  RARE  POCT PREGNANCY, URINE     Status: None   Collection Time    06/22/13 11:04 PM      Result Value Ref Range   Preg Test, Ur NEGATIVE  NEGATIVE  CBC     Status: Abnormal   Collection Time    06/22/13 11:45 PM      Result Value Ref Range   WBC 7.4  4.0 - 10.5  K/uL   RBC 3.84 (*) 3.87 - 5.11 MIL/uL   Hemoglobin 12.1  12.0 - 15.0 g/dL   HCT 16.134.2 (*) 09.636.0 - 04.546.0 %   MCV 89.1  78.0 - 100.0 fL   MCH 31.5  26.0 - 34.0 pg   MCHC 35.4  30.0 - 36.0 g/dL   RDW 40.912.0  81.111.5 - 91.415.5 %   Platelets 231  150 - 400 K/uL  COMPREHENSIVE METABOLIC PANEL     Status: Abnormal   Collection Time    06/22/13 11:45 PM      Result Value Ref Range   Sodium 139  137 - 147 mEq/L   Potassium 3.7  3.7 - 5.3 mEq/L   Chloride 101  96 - 112 mEq/L   CO2 26  19 - 32 mEq/L   Glucose, Bld 125 (*) 70 - 99 mg/dL   BUN 8  6 - 23 mg/dL   Creatinine, Ser 7.820.44 (*) 0.50 - 1.10 mg/dL   Calcium 9.2  8.4 - 95.610.5 mg/dL   Total Protein 6.6  6.0 - 8.3 g/dL   Albumin 3.6  3.5 - 5.2 g/dL   AST 17  0 - 37 U/L   ALT 20  0 - 35 U/L   Alkaline Phosphatase 63  39 - 117 U/L   Total Bilirubin 0.3  0.3 - 1.2 mg/dL   GFR calc non Af Amer >90  >90 mL/min   GFR calc Af Amer >90  >90 mL/min  POCT PREGNANCY, URINE     Status: None   Collection Time    06/23/13  1:45 AM      Result Value Ref Range   Preg Test, Ur NEGATIVE  NEGATIVE    IMAGING [place] Abdomen Complete  06/23/2013   CLINICAL DATA:  Upper abdominal pain and nausea.  Postpartum.  EXAM: ULTRASOUND ABDOMEN COMPLETE  COMPARISON:  None.  FINDINGS: Gallbladder:  Multiple gallstones are present. The largest is 9 mm in diameter. No wall thickening or Murphy's sign.  Common bile duct:  Diameter: 3 mm in caliber.  Liver:  No focal lesion identified. Within normal limits in parenchymal echogenicity.  IVC:  No abnormality visualized.  Pancreas:  Visualized portion unremarkable.  Spleen:  Size and appearance within normal limits.  Right Kidney:  Length: 10.7 cm in length. Echogenicity within normal limits. No mass or hydronephrosis visualized.  Left Kidney:  Length: 12.5 cm in length. Echogenicity within normal limits. No mass or hydronephrosis visualized.  Abdominal aorta:  No aneurysm visualized.  Other findings:  None.  IMPRESSION: Cholelithiasis.    Electronically Signed   By: Maryclare BeanArt  Hoss M.D.   On: 06/23/2013 01:16    MAU COURSE  ASSESSMENT 1. Gall stones    PLAN Discharge home in stable condition.  Low fat diet. Cholcystitis precautions     Follow-up Information   [facility] ([facility]    Specialty:  Family Medicine   Contact information:   [address] [id number] [place] [address] [phone]      Follow up with [facility]. (To discuss removal of gallbladder)    Contact information:   [address] [place] [id number] [phone]       Medication List    STOP taking these medications       sulfamethoxazole-trimethoprim 800-160 MG per tablet  Commonly known as:  BACTRIM DS      TAKE these medications       famotidine 20 MG tablet  Commonly known as:  PEPCID  Take 1 tablet (20 mg total) by mouth 2 (two) times daily.     ibuprofen 600 MG tablet  Commonly known as:  ADVIL,MOTRIN  Take 1 tablet (600 mg total) by mouth every 6 (six) hours as needed for pain.     oxyCODONE-acetaminophen 5-325 MG per tablet  Commonly known as:  PERCOCET/ROXICET  Take 1-2 tablets by mouth every 4 (four) hours as needed for severe pain.     promethazine 25 MG tablet  Commonly known as:  PHENERGAN  Take 1 tablet (25 mg total) by mouth every 6 (six) hours as needed for nausea or vomiting.         RossVirginia Linc Renne, CNM 06/23/2013  2:10 AM

## 2013-06-23 ENCOUNTER — Inpatient Hospital Stay (HOSPITAL_COMMUNITY): Payer: Medicaid Other

## 2013-06-23 LAB — COMPREHENSIVE METABOLIC PANEL
ALK PHOS: 63 U/L (ref 39–117)
ALT: 20 U/L (ref 0–35)
AST: 17 U/L (ref 0–37)
Albumin: 3.6 g/dL (ref 3.5–5.2)
BILIRUBIN TOTAL: 0.3 mg/dL (ref 0.3–1.2)
BUN: 8 mg/dL (ref 6–23)
CHLORIDE: 101 meq/L (ref 96–112)
CO2: 26 meq/L (ref 19–32)
CREATININE: 0.44 mg/dL — AB (ref 0.50–1.10)
Calcium: 9.2 mg/dL (ref 8.4–10.5)
GFR calc Af Amer: >90 mL/min (ref >90)
Glucose, Bld: 125 mg/dL — ABNORMAL HIGH (ref 70–99)
POTASSIUM: 3.7 meq/L (ref 3.7–5.3)
Sodium: 139 mEq/L (ref 137–147)
Total Protein: 6.6 g/dL (ref 6.0–8.3)

## 2013-06-23 LAB — CBC
HEMATOCRIT: 34.2 % — AB (ref 36.0–46.0)
Hemoglobin: 12.1 g/dL (ref 12.0–15.0)
MCH: 31.5 pg (ref 26.0–34.0)
MCHC: 35.4 g/dL (ref 30.0–36.0)
MCV: 89.1 fL (ref 78.0–100.0)
PLATELETS: 231 10*3/uL (ref 150–400)
RBC: 3.84 MIL/uL — AB (ref 3.87–5.11)
RDW: 12 % (ref 11.5–15.5)
WBC: 7.4 10*3/uL (ref 4.0–10.5)

## 2013-06-23 LAB — POCT PREGNANCY, URINE: Preg Test, Ur: NEGATIVE

## 2013-06-23 MED ORDER — PROMETHAZINE HCL 25 MG PO TABS: 25.0000 mg | ORAL_TABLET | Freq: Four times a day (QID) | ORAL | Status: DC | PRN

## 2013-06-23 MED ORDER — OXYCODONE-ACETAMINOPHEN 5-325 MG PO TABS
1.0000 | ORAL_TABLET | ORAL | Status: DC | PRN
Start: 2013-06-23 — End: 2018-09-27

## 2013-06-23 MED ORDER — FAMOTIDINE 20 MG PO TABS: 20.0000 mg | ORAL_TABLET | Freq: Two times a day (BID) | ORAL | Status: DC

## 2014-01-01 ENCOUNTER — Encounter (HOSPITAL_COMMUNITY): Payer: Self-pay

## 2014-02-27 ENCOUNTER — Encounter (HOSPITAL_COMMUNITY): Payer: Self-pay | Admitting: Family

## 2014-02-27 ENCOUNTER — Inpatient Hospital Stay (HOSPITAL_COMMUNITY)
Admission: AD | Admit: 2014-02-27 | Discharge: 2014-02-27 | Disposition: A | Discharge status: Home / Self Care | Payer: Medicaid Other | Source: Ambulatory Visit | Attending: Obstetrics & Gynecology | Admitting: Obstetrics & Gynecology

## 2014-02-27 DIAGNOSIS — N390 Urinary tract infection, site not specified: Secondary | ICD-10-CM

## 2014-02-27 DIAGNOSIS — Z9049 Acquired absence of other specified parts of digestive tract: Secondary | ICD-10-CM | POA: Insufficient documentation

## 2014-02-27 LAB — URINALYSIS, ROUTINE W REFLEX MICROSCOPIC
Bilirubin Urine: NEGATIVE
GLUCOSE, UA: NEGATIVE mg/dL
Ketones, ur: NEGATIVE mg/dL
NITRITE: NEGATIVE
PH: 7 (ref 5.0–8.0)
Protein, ur: 30 mg/dL — AB
SPECIFIC GRAVITY, URINE: 1.01 (ref 1.005–1.030)
Urobilinogen, UA: 0.2 mg/dL (ref 0.0–1.0)

## 2014-02-27 LAB — POCT PREGNANCY, URINE: PREG TEST UR: NEGATIVE

## 2014-02-27 LAB — URINE MICROSCOPIC-ADD ON

## 2014-02-27 MED ORDER — NITROFURANTOIN MONOHYD MACRO 100 MG PO CAPS: 100.0000 mg | ORAL_CAPSULE | Freq: Two times a day (BID) | ORAL | Status: DC

## 2014-02-27 MED ORDER — PHENAZOPYRIDINE HCL 100 MG PO TABS
100.0000 mg | ORAL_TABLET | Freq: Once | ORAL | Status: AC
  Administered 2014-02-27: 100 mg via ORAL
  Filled 2014-02-27: qty 1

## 2014-02-27 NOTE — MAU Provider Note (Signed)
History     CSN: [id number]  Arrival date and time: 02/27/14 1622   First Provider Initiated Contact with Patient 02/27/14 1722      Chief Complaint  Patient presents with  . Urinary Tract Infection   HPI  Jackie Cooper is a [id number] here with report of  burning and frequency with urination for the last four days. States she had some blood in her urine the first two days of onset but doesn't see blood anymore. Denies fever. Also reports suprapubic pain and pressure that is slightly right of midline. Denies vaginal discharge or vaginal itching. Current birth control method:  IUD.    Past Medical History  Diagnosis Date  . Varicose veins   . Depression     pp depression    Past Surgical History  Procedure Laterality Date  . Cesarean section      Family History  Problem Relation Age of Onset  . Diabetes Maternal Grandmother     History  Substance Use Topics  . Smoking status: Never Smoker   . Smokeless tobacco: Never Used  . Alcohol Use: No    Allergies: No Known Allergies  Prescriptions prior to admission  Medication Sig Dispense Refill Last Dose  . ibuprofen (ADVIL,MOTRIN) 600 MG tablet Take 1 tablet (600 mg total) by mouth every 6 (six) hours as needed for pain. 30 tablet 0 02/26/2014 at 2200  . oxyCODONE-acetaminophen (PERCOCET/ROXICET) 5-325 MG per tablet Take 1-2 tablets by mouth every 4 (four) hours as needed for severe pain. 30 tablet 0 Past Week at Unknown time  . promethazine (PHENERGAN) 25 MG tablet Take 1 tablet (25 mg total) by mouth every 6 (six) hours as needed for nausea or vomiting. 30 tablet 1 Past Month at Unknown time  . famotidine (PEPCID) 20 MG tablet Take 1 tablet (20 mg total) by mouth 2 (two) times daily. (Patient not taking: Reported on 02/27/2014) 60 tablet 1 Not Taking at Unknown time    Review of Systems  Constitutional: Negative for fever and chills.  Genitourinary: Positive for dysuria, urgency, frequency and  hematuria. Negative for flank pain.  Musculoskeletal: Positive for back pain (bilateral lower back pain).   Physical Exam   Blood pressure 112/60, pulse 83, resp. rate 16, unknown if currently breastfeeding.  Physical Exam  Constitutional: She is oriented to person, place, and time. She appears well-developed and well-nourished. No distress.  HENT:  Head: Normocephalic.  Eyes: Pupils are equal, round, and reactive to light.  Neck: Normal range of motion. Neck supple.  Cardiovascular: Normal rate, regular rhythm and normal heart sounds.   Respiratory: Effort normal and breath sounds normal.  GI: Soft. There is no tenderness.  Genitourinary: There is no lesion on the right labia. There is no lesion on the left labia. No erythema or bleeding in the vagina. No vaginal discharge found.  Neurological: She is alert and oriented to person, place, and time. She has normal reflexes.  Skin: Skin is warm and dry.    MAU Course  Procedures  Results for orders placed or performed during the hospital encounter of 02/27/14 (from the past 24 hour(s))  Urinalysis, Routine w reflex microscopic     Status: Abnormal   Collection Time: 02/27/14  4:35 PM  Result Value Ref Range   Color, Urine YELLOW YELLOW   APPearance HAZY (A) CLEAR   Specific Gravity, Urine 1.010 1.005 - 1.030   pH 7.0 5.0 - 8.0   Glucose, UA NEGATIVE NEGATIVE mg/dL   Hgb  urine dipstick LARGE (A) NEGATIVE   Bilirubin Urine NEGATIVE NEGATIVE   Ketones, ur NEGATIVE NEGATIVE mg/dL   Protein, ur 30 (A) NEGATIVE mg/dL   Urobilinogen, UA 0.2 0.0 - 1.0 mg/dL   Nitrite NEGATIVE NEGATIVE   Leukocytes, UA LARGE (A) NEGATIVE  Urine microscopic-add on     Status: Abnormal   Collection Time: 02/27/14  4:35 PM  Result Value Ref Range   Squamous Epithelial / LPF FEW (A) RARE   WBC, UA TOO NUMEROUS TO COUNT <3 WBC/hpf   RBC / HPF 11-20 <3 RBC/hpf   Bacteria, UA FEW (A) RARE  Pregnancy, urine POC     Status: None   Collection Time:  02/27/14  5:17 PM  Result Value Ref Range   Preg Test, Ur NEGATIVE NEGATIVE     Assessment and Plan  UTI  Plan:  Discharge to home RX Macrobid 100 mg BID x 5 days Urine culture to lab  Marlis EdelsonWalidah N Karim, CNM

## 2014-02-27 NOTE — MAU Note (Signed)
Patient reports she had a laproscopic cholecystectomy on 01/18/14. No redness or swelling present at puncture sites.

## 2014-02-27 NOTE — MAU Note (Signed)
Patient complaining of burning and frequency with urination for the last four days. States she had some blood in her urine the first two days of onset but doesn't see blood anymore. Denies fever. Does c/o of some suprapubic pain and pressure that is slightly right of midline. Denies vaginal discharge.

## 2014-03-04 LAB — URINE CULTURE

## 2014-09-19 ENCOUNTER — Encounter (HOSPITAL_COMMUNITY): Payer: Self-pay | Admitting: Emergency Medicine

## 2014-09-19 ENCOUNTER — Emergency Department (HOSPITAL_COMMUNITY)
Admission: EM | Admit: 2014-09-19 | Discharge: 2014-09-19 | Disposition: A | Discharge status: Home / Self Care | Payer: Medicaid Other | Attending: Emergency Medicine | Admitting: Emergency Medicine

## 2014-09-19 DIAGNOSIS — E876 Hypokalemia: Secondary | ICD-10-CM | POA: Insufficient documentation

## 2014-09-19 DIAGNOSIS — R109 Unspecified abdominal pain: Secondary | ICD-10-CM

## 2014-09-19 DIAGNOSIS — N179 Acute kidney failure, unspecified: Secondary | ICD-10-CM | POA: Insufficient documentation

## 2014-09-19 DIAGNOSIS — Z8659 Personal history of other mental and behavioral disorders: Secondary | ICD-10-CM | POA: Insufficient documentation

## 2014-09-19 DIAGNOSIS — Z79899 Other long term (current) drug therapy: Secondary | ICD-10-CM | POA: Insufficient documentation

## 2014-09-19 DIAGNOSIS — E860 Dehydration: Secondary | ICD-10-CM | POA: Insufficient documentation

## 2014-09-19 DIAGNOSIS — A059 Bacterial foodborne intoxication, unspecified: Secondary | ICD-10-CM | POA: Insufficient documentation

## 2014-09-19 DIAGNOSIS — R197 Diarrhea, unspecified: Secondary | ICD-10-CM

## 2014-09-19 DIAGNOSIS — R112 Nausea with vomiting, unspecified: Secondary | ICD-10-CM

## 2014-09-19 LAB — CBC WITH DIFFERENTIAL/PLATELET
Basophils Absolute: 0 10*3/uL (ref 0.0–0.1)
Basophils Relative: 0 % (ref 0–1)
EOS ABS: 0 10*3/uL (ref 0.0–0.7)
Eosinophils Relative: 0 % (ref 0–5)
HEMATOCRIT: 38.8 % (ref 36.0–46.0)
HEMOGLOBIN: 14.3 g/dL (ref 12.0–15.0)
LYMPHS PCT: 13 % (ref 12–46)
Lymphs Abs: 0.7 10*3/uL (ref 0.7–4.0)
MCH: 32.1 pg (ref 26.0–34.0)
MCHC: 36.9 g/dL — ABNORMAL HIGH (ref 30.0–36.0)
MCV: 87 fL (ref 78.0–100.0)
Monocytes Absolute: 0.3 10*3/uL (ref 0.1–1.0)
Monocytes Relative: 6 % (ref 3–12)
NEUTROS PCT: 81 % — AB (ref 43–77)
Neutro Abs: 4.5 10*3/uL (ref 1.7–7.7)
PLATELETS: 215 10*3/uL (ref 150–400)
RBC: 4.46 MIL/uL (ref 3.87–5.11)
RDW: 11.9 % (ref 11.5–15.5)
WBC: 5.5 10*3/uL (ref 4.0–10.5)

## 2014-09-19 LAB — COMPREHENSIVE METABOLIC PANEL
ALBUMIN: 4.1 g/dL (ref 3.5–5.0)
ALT: 13 U/L — AB (ref 14–54)
AST: 23 U/L (ref 15–41)
Alkaline Phosphatase: 47 U/L (ref 38–126)
Anion gap: 14 (ref 5–15)
BUN: 21 mg/dL — AB (ref 6–20)
CALCIUM: 9.4 mg/dL (ref 8.9–10.3)
CO2: 22 mmol/L (ref 22–32)
CREATININE: 1.2 mg/dL — AB (ref 0.44–1.00)
Chloride: 96 mmol/L — ABNORMAL LOW (ref 101–111)
GFR calc Af Amer: >60 mL/min (ref >60)
GFR calc non Af Amer: 59 mL/min — ABNORMAL LOW (ref >60)
GLUCOSE: 123 mg/dL — AB (ref 65–99)
POTASSIUM: 3 mmol/L — AB (ref 3.5–5.1)
SODIUM: 132 mmol/L — AB (ref 135–145)
TOTAL PROTEIN: 7.5 g/dL (ref 6.5–8.1)
Total Bilirubin: 0.8 mg/dL (ref 0.3–1.2)

## 2014-09-19 LAB — I-STAT BETA HCG BLOOD, ED (MC, WL, AP ONLY)

## 2014-09-19 LAB — LIPASE, BLOOD: Lipase: 15 U/L — ABNORMAL LOW (ref 22–51)

## 2014-09-19 LAB — POC OCCULT BLOOD, ED: Fecal Occult Bld: NEGATIVE

## 2014-09-19 MED ORDER — DICYCLOMINE HCL 20 MG PO TABS
20.0000 mg | ORAL_TABLET | Freq: Two times a day (BID) | ORAL | Status: DC | PRN
Start: 2014-09-19 — End: 2018-09-27

## 2014-09-19 MED ORDER — POTASSIUM CHLORIDE CRYS ER 20 MEQ PO TBCR
60.0000 meq | EXTENDED_RELEASE_TABLET | Freq: Once | ORAL | Status: AC
  Administered 2014-09-19: 60 meq via ORAL
  Filled 2014-09-19: qty 3

## 2014-09-19 MED ORDER — NAPROXEN 500 MG PO TABS: 500.0000 mg | ORAL_TABLET | Freq: Two times a day (BID) | ORAL | Status: DC | PRN

## 2014-09-19 MED ORDER — ONDANSETRON HCL 8 MG PO TABS: 8.0000 mg | ORAL_TABLET | Freq: Three times a day (TID) | ORAL | Status: DC | PRN

## 2014-09-19 MED ORDER — DICYCLOMINE HCL 10 MG/ML IM SOLN
20.0000 mg | Freq: Once | INTRAMUSCULAR | Status: AC
  Administered 2014-09-19: 20 mg via INTRAMUSCULAR
  Filled 2014-09-19: qty 2

## 2014-09-19 MED ORDER — HYDROCODONE-ACETAMINOPHEN 5-325 MG PO TABS: 1.0000 | ORAL_TABLET | Freq: Four times a day (QID) | ORAL | Status: DC | PRN

## 2014-09-19 MED ORDER — SODIUM CHLORIDE 0.9 % IV BOLUS (SEPSIS)
1000.0000 mL | Freq: Once | INTRAVENOUS | Status: AC
  Administered 2014-09-19: 1000 mL via INTRAVENOUS

## 2014-09-19 MED ORDER — ONDANSETRON HCL 4 MG/2ML IJ SOLN
4.0000 mg | Freq: Once | INTRAMUSCULAR | Status: AC
  Administered 2014-09-19: 4 mg via INTRAVENOUS
  Filled 2014-09-19: qty 2

## 2014-09-19 MED ORDER — MORPHINE SULFATE 2 MG/ML IJ SOLN
2.0000 mg | Freq: Once | INTRAMUSCULAR | Status: AC
  Administered 2014-09-19: 2 mg via INTRAVENOUS
  Filled 2014-09-19: qty 1

## 2014-09-19 NOTE — ED Notes (Signed)
Pt states she is unable to give us a urine sample at this moment

## 2014-09-19 NOTE — ED Provider Notes (Signed)
CSN: [id number]     Arrival date & time 09/19/14  1232 History   First MD Initiated Contact with Patient 09/19/14 1256     Chief Complaint  Patient presents with  . Nausea  . Diarrhea  . Emesis     (Consider location/radiation/quality/duration/timing/severity/associated sxs/prior Treatment) HPI Comments: Jackie Cooper is a 34 y.o. female with a PMHx of varicose veins and postpartum depression, who presents to the ED with complaints of abdominal pain, chills, nausea, vomiting, and diarrhea since Sunday. She reports that she ate cow beef on Saturday and that the entire family developed the same symptoms, beginning on Sunday. She reports 10 episodes daily of nonbloody nonbilious emesis, and approximately 10-15 episodes daily of nonbloody watery green stool. She describes her abd pain as 10/10 intermittent generalized nonradiating cramping pain, worse with feeling the need to have a bowel movement, and improved after having a bowel movement, unrelieved by tylenol. Her husband and son are also affected by similar symptoms, husband is here currently being evaluated for the same symptoms in the ER. She denies any fevers, chest pain, shortness breath, constipation, obstipation, melena, hematochezia, hematemesis, rectal pain, dysuria, hematuria, flank pain, vaginal bleeding/discharge, numbness, tingling, weakness, lightheadedness, recent travel, antibiotic use, alcohol use, or NSAIDs. She has a PCP on [address] but can't remember his name. Has no menses due to having an IUD.  Patient is a 34 y.o. female presenting with diarrhea and vomiting. The history is provided by the patient. No language interpreter was used.  Diarrhea Quality:  Watery Severity:  Moderate Onset quality:  Gradual Number of episodes:  10-15x/day Duration:  3 days Timing:  Constant Progression:  Unchanged Relieved by:  None tried Worsened by:  Nothing tried Ineffective treatments:  None tried Associated  symptoms: abdominal pain, chills and vomiting   Associated symptoms: no arthralgias, no fever and no myalgias   Risk factors: sick contacts and suspect food intake   Risk factors: no recent antibiotic use and no travel to endemic areas   Emesis Associated symptoms: abdominal pain, chills and diarrhea   Associated symptoms: no arthralgias and no myalgias     Past Medical History  Diagnosis Date  . Varicose veins   . Depression     pp depression   Past Surgical History  Procedure Laterality Date  . Cesarean section     Family History  Problem Relation Age of Onset  . Diabetes Maternal Grandmother    History  Substance Use Topics  . Smoking status: Never Smoker   . Smokeless tobacco: Never Used  . Alcohol Use: No   OB History    Gravida Para Term Preterm AB TAB SAB Ectopic Multiple Living   5 4 4  1  1   4      Review of Systems  Constitutional: Positive for chills. Negative for fever.  Respiratory: Negative for shortness of breath.   Cardiovascular: Negative for chest pain.  Gastrointestinal: Positive for nausea, vomiting, abdominal pain and diarrhea. Negative for constipation, blood in stool and rectal pain.  Genitourinary: Negative for dysuria, hematuria, flank pain, vaginal bleeding and vaginal discharge.  Musculoskeletal: Negative for myalgias and arthralgias.  Skin: Negative for color change.  Allergic/Immunologic: Negative for immunocompromised state.  Neurological: Negative for weakness, light-headedness and numbness.  Psychiatric/Behavioral: Negative for confusion.   10 Systems reviewed and are negative for acute change except as noted in the HPI.    Allergies  Review of patient's allergies indicates no known allergies.  Home Medications   Prior  to Admission medications   Medication Sig Start Date End Date Taking? Authorizing Provider  ibuprofen (ADVIL,MOTRIN) 600 MG tablet Take 1 tablet (600 mg total) by mouth every 6 (six) hours as needed for pain.  06/20/12   Latrelle Dodrill, MD  nitrofurantoin, macrocrystal-monohydrate, (MACROBID) 100 MG capsule Take 1 capsule (100 mg total) by mouth 2 (two) times daily. 02/27/14   Marlis Edelson, CNM  oxyCODONE-acetaminophen (PERCOCET/ROXICET) 5-325 MG per tablet Take 1-2 tablets by mouth every 4 (four) hours as needed for severe pain. 06/23/13   Koleen Nimrod Smith, CNM   BP 122/107 mmHg  Pulse 94  Temp(Src) 97.5 F (36.4 C) (Oral)  Resp 18  SpO2 99% Physical Exam  Constitutional: She is oriented to person, place, and time. Vital signs are normal. She appears well-developed and well-nourished.  Non-toxic appearance. She appears distressed.  Afebrile, nontoxic, appears uncomfortable, rushing to use restroom during exam  HENT:  Head: Normocephalic and atraumatic.  Mouth/Throat: Oropharynx is clear and moist and mucous membranes are normal.  Eyes: Conjunctivae and EOM are normal. Right eye exhibits no discharge. Left eye exhibits no discharge.  Neck: Normal range of motion. Neck supple.  Cardiovascular: Normal rate, regular rhythm, normal heart sounds and intact distal pulses.  Exam reveals no gallop and no friction rub.   No murmur heard. Pulmonary/Chest: Effort normal and breath sounds normal. No respiratory distress. She has no decreased breath sounds. She has no wheezes. She has no rhonchi. She has no rales.  Abdominal: Soft. Normal appearance and bowel sounds are normal. She exhibits no distension. There is generalized tenderness. There is no rigidity, no rebound, no guarding, no CVA tenderness, no tenderness at McBurney's point and negative Murphy's sign.  Soft, nondistended, +BS throughout, with mild generalized tenderness without focal area of tenderness, no r/g/r, neg murphy's, neg mcburney's, no CVA TTP   Musculoskeletal: Normal range of motion.  Neurological: She is alert and oriented to person, place, and time. She has normal strength. No sensory deficit.  Skin: Skin is warm, dry and intact.  No rash noted.  Psychiatric: She has a normal mood and affect.  Nursing note and vitals reviewed.   ED Course  Procedures (including critical care time) Labs Review Labs Reviewed  CBC WITH DIFFERENTIAL/PLATELET - Abnormal; Notable for the following:    MCHC 36.9 (*)    Neutrophils Relative % 81 (*)    All other components within normal limits  COMPREHENSIVE METABOLIC PANEL - Abnormal; Notable for the following:    Sodium 132 (*)    Potassium 3.0 (*)    Chloride 96 (*)    Glucose, Bld 123 (*)    BUN 21 (*)    Creatinine, Ser 1.20 (*)    ALT 13 (*)    GFR calc non Af Amer 59 (*)    All other components within normal limits  LIPASE, BLOOD - Abnormal; Notable for the following:    Lipase 15 (*)    All other components within normal limits  GI PATHOGEN PANEL BY PCR, STOOL  POC OCCULT BLOOD, ED  I-STAT BETA HCG BLOOD, ED ([facility], [facility], [facility] ONLY)    Imaging Review No results found.   EKG Interpretation None      MDM   Final diagnoses:  Foodborne gastroenteritis  Nausea, vomiting, and diarrhea  Abdominal cramping  Food poisoning  Dehydration  AKI (acute kidney injury)  Hypokalemia    34 y.o. female here with n/v/d/abd pain since eating beef on Saturday, symptoms started Sunday. Husband  here with same symptoms, and son has same symptoms too. On exam, abd tenderness throughout, very mild, nonperitoneal, pt rushing off to use restroom. No focal area of tenderness. VSS. Likely gastroenteritis vs foodbourne illness. Will get labs, u/a, upreg, GI panel, and give fluids, nausea meds, and bentyl. Will reassess shortly.   3:06 PM FOBT neg. GI panel in process. CBC w/diff unremarkable. Lipase WNL. CMP showing mildly low K at 3.0, given ongoing diarrhea will replete. BUN and Cr mildly elevated, very slightly, likely from dehydration, 1st bolus still hanging, will give total of 2L NS prior to d/c. Nausea improved but still having some pain, will give low dose morphine. Still hasn't been  able to provide U/A, again likely from dehydration. Will PO challenge now and reassess shortly. Doubt need for abx given that FOBT was negative and foodbourne illness could be viral in nature, and pt nontoxic without fever, discussed that GI panel will return and if bacterial etiology lab will call pt to give treatment.  3:34 PM Tolerating PO well. Pain much better after morphine. No urinary symptoms, and don't want to delay discharge, therefore doubt need for U/A, will cancel. Will let fluids finish then d/c home with pain meds, nausea meds, and bentyl. Will have her f/up with PCP in 1wk. Discussed BRAT diet. Once fluids finish, nursing will d/c pt. I explained the diagnosis and have given explicit precautions to return to the ER including for any other new or worsening symptoms. The patient understands and accepts the medical plan as it's been dictated and I have answered their questions. Discharge instructions concerning home care and prescriptions have been given. The patient is STABLE and is discharged to home in good condition.  BP 111/68 mmHg  Pulse 70  Temp(Src) 97.5 F (36.4 C) (Oral)  Resp 18  SpO2 99%  Meds ordered this encounter  Medications  . sodium chloride 0.9 % bolus 1,000 mL    Sig:   . ondansetron (ZOFRAN) injection 4 mg    Sig:   . dicyclomine (BENTYL) injection 20 mg    Sig:   . sodium chloride 0.9 % bolus 1,000 mL    Sig:   . potassium chloride SA (K-DUR,KLOR-CON) CR tablet 60 mEq    Sig:   . morphine 2 MG/ML injection 2 mg    Sig:   . ondansetron (ZOFRAN) 8 MG tablet    Sig: Take 1 tablet (8 mg total) by mouth every 8 (eight) hours as needed for nausea or vomiting.    Dispense:  10 tablet    Refill:  0    Order Specific Question:  Supervising Provider    Answer:  Hyacinth Meeker, BRIAN [[id number]]  . dicyclomine (BENTYL) 20 MG tablet    Sig: Take 1 tablet (20 mg total) by mouth 2 (two) times daily as needed for spasms (abdominal cramping).    Dispense:  14 tablet     Refill:  0    Order Specific Question:  Supervising Provider    Answer:  MILLER, BRIAN [[id number]]  . naproxen (NAPROSYN) 500 MG tablet    Sig: Take 1 tablet (500 mg total) by mouth 2 (two) times daily as needed for mild pain, moderate pain or headache (TAKE WITH MEALS.).    Dispense:  20 tablet    Refill:  0    Order Specific Question:  Supervising Provider    Answer:  MILLER, BRIAN [[id number]]  . HYDROcodone-acetaminophen (NORCO) 5-325 MG per tablet    Sig: Take 1 tablet by  mouth every 6 (six) hours as needed for severe pain.    Dispense:  10 tablet    Refill:  0    Order Specific Question:  Supervising Provider    Answer:  Eber HongMILLER, BRIAN [[id number]]     Barbi Kumagai Camprubi-Soms, PA-C 09/19/14 1536  Blane OharaJoshua Zavitz, MD 09/21/14 [phone]

## 2014-09-19 NOTE — Discharge Instructions (Signed)
Use zofran as prescribed, as needed for nausea. Stay well hydrated with sips of fluids throughout the day. Use bentyl as directed for abdominal cramping. Use norco and naprosyn as directed as needed for pain but don't drive or operate machinery while taking norco. Follow a BRAT (banana-rice-applesauce-toast) diet as described below for the next 24-48 hours. The 'BRAT' diet is suggested, then progress to diet as tolerated as symptoms abate. Call if bloody stools, persistent diarrhea, vomiting, fever or abdominal pain. Follow up with your regular doctor [facility] [address] [facility] [address] in 1 week for recheck of symptoms and for ongoing medical care. Return to ER for changing or worsening of symptoms.   Food Choices to Help Relieve Diarrhea When you have diarrhea, the foods you eat and your eating habits are very important. Choosing the right foods and drinks can help relieve diarrhea. Also, because diarrhea can last up to 7 days, you need to replace lost fluids and electrolytes (such as sodium, potassium, and chloride) in order to help prevent dehydration.  WHAT GENERAL GUIDELINES DO I NEED TO FOLLOW?  Slowly drink 1 cup (8 oz) of fluid for each episode of diarrhea. If you are getting enough fluid, your urine will be clear or pale yellow.  Eat starchy foods. Some good choices include white rice, white toast, pasta, low-fiber cereal, baked potatoes (without the skin), saltine crackers, and bagels.  Avoid large servings of any cooked vegetables.  Limit fruit to two servings per day. A serving is  cup or 1 small piece.  Choose foods with less than 2 g of fiber per serving.  Limit fats to less than 8 tsp (38 g) per day.  Avoid fried foods.  Eat foods that have probiotics in them. Probiotics can be found in certain dairy products.  Avoid foods and beverages that may increase the speed at which food moves through the stomach and intestines (gastrointestinal tract). Things to avoid include:  High-fiber foods, such  as dried fruit, raw fruits and vegetables, nuts, seeds, and whole grain foods.  Spicy foods and high-fat foods.  Foods and beverages sweetened with high-fructose corn syrup, honey, or sugar alcohols such as xylitol, sorbitol, and mannitol. WHAT FOODS ARE RECOMMENDED? Grains White rice. White, [place], or pita breads (fresh or toasted), including plain rolls, buns, or bagels. White pasta. Saltine, soda, or graham crackers. Pretzels. Low-fiber cereal. Cooked cereals made with water (such as cornmeal, farina, or cream cereals). Plain muffins. Matzo. Melba toast. Zwieback.  Vegetables Potatoes (without the skin). Strained tomato and vegetable juices. Most well-cooked and canned vegetables without seeds. Tender lettuce. Fruits Cooked or canned applesauce, apricots, cherries, fruit cocktail, grapefruit, peaches, pears, or plums. Fresh bananas, apples without skin, cherries, grapes, cantaloupe, grapefruit, peaches, oranges, or plums.  Meat and Other Protein Products Baked or boiled chicken. Eggs. Tofu. Fish. Seafood. Smooth peanut butter. Ground or well-cooked tender beef, ham, veal, lamb, pork, or poultry.  Dairy Plain yogurt, kefir, and unsweetened liquid yogurt. Lactose-free milk, buttermilk, or soy milk. Plain hard cheese. Beverages Sport drinks. Clear broths. Diluted fruit juices (except prune). Regular, caffeine-free sodas such as ginger ale. Water. Decaffeinated teas. Oral rehydration solutions. Sugar-free beverages not sweetened with sugar alcohols. Other Bouillon, broth, or soups made from recommended foods.  The items listed above may not be a complete list of recommended foods or beverages. Contact your dietitian for more options. WHAT FOODS ARE NOT RECOMMENDED? Grains Whole grain, whole wheat, bran, or rye breads, rolls, pastas, crackers, and cereals. Wild or brown rice. Cereals that  contain more than 2 g of fiber per serving. Corn tortillas or taco shells. Cooked or dry oatmeal. Granola.  Popcorn. Vegetables Raw vegetables. Cabbage, broccoli, Brussels sprouts, artichokes, baked beans, beet greens, corn, kale, legumes, peas, sweet potatoes, and yams. Potato skins. Cooked spinach and cabbage. Fruits Dried fruit, including raisins and dates. Raw fruits. Stewed or dried prunes. Fresh apples with skin, apricots, mangoes, pears, raspberries, and strawberries.  Meat and Other Protein Products Chunky peanut butter. Nuts and seeds. Beans and lentils. Tomasa Blase.  Dairy High-fat cheeses. Milk, chocolate milk, and beverages made with milk, such as milk shakes. Cream. Ice cream. Sweets and Desserts Sweet rolls, doughnuts, and sweet breads. Pancakes and waffles. Fats and Oils Butter. Cream sauces. Margarine. Salad oils. Plain salad dressings. Olives. Avocados.  Beverages Caffeinated beverages (such as coffee, tea, soda, or energy drinks). Alcoholic beverages. Fruit juices with pulp. Prune juice. Soft drinks sweetened with high-fructose corn syrup or sugar alcohols. Other Coconut. Hot sauce. Chili powder. Mayonnaise. Gravy. Cream-based or milk-based soups.  The items listed above may not be a complete list of foods and beverages to avoid. Contact your dietitian for more information. WHAT SHOULD I DO IF I BECOME DEHYDRATED? Diarrhea can sometimes lead to dehydration. Signs of dehydration include dark urine and dry mouth and skin. If you think you are dehydrated, you should rehydrate with an oral rehydration solution. These solutions can be purchased at pharmacies, retail stores, or online.  Drink -1 cup (120-240 mL) of oral rehydration solution each time you have an episode of diarrhea. If drinking this amount makes your diarrhea worse, try drinking smaller amounts more often. For example, drink 1-3 tsp (5-15 mL) every 5-10 minutes.  A general rule for staying hydrated is to drink 1-2 L of fluid per day. Talk to your health care provider about the specific amount you should be drinking each day.  Drink enough fluids to keep your urine clear or pale yellow. Document Released: 05/09/2003 Document Revised: 02/21/2013 Document Reviewed: 01/09/2013 [facility] Patient Information 2015 [place], [place]. This information is not intended to replace advice given to you by your health care provider. Make sure you discuss any questions you have with your health care provider.    Viral Gastroenteritis Viral gastroenteritis is also known as stomach flu. This condition affects the stomach and intestinal tract. It can cause sudden diarrhea and vomiting. The illness typically lasts 3 to 8 days. Most people develop an immune response that eventually gets rid of the virus. While this natural response develops, the virus can make you quite ill. CAUSES  Many different viruses can cause gastroenteritis, such as rotavirus or noroviruses. You can catch one of these viruses by consuming contaminated food or water. You may also catch a virus by sharing utensils or other personal items with an infected person or by touching a contaminated surface. SYMPTOMS  The most common symptoms are diarrhea and vomiting. These problems can cause a severe loss of body fluids (dehydration) and a body salt (electrolyte) imbalance. Other symptoms may include:  Fever.  Headache.  Fatigue.  Abdominal pain. DIAGNOSIS  Your caregiver can usually diagnose viral gastroenteritis based on your symptoms and a physical exam. A stool sample may also be taken to test for the presence of viruses or other infections. TREATMENT  This illness typically goes away on its own. Treatments are aimed at rehydration. The most serious cases of viral gastroenteritis involve vomiting so severely that you are not able to keep fluids down. In these cases, fluids must  be given through an intravenous line (IV). HOME CARE INSTRUCTIONS   Drink enough fluids to keep your urine clear or pale yellow. Drink small amounts of fluids frequently and increase the  amounts as tolerated.  Ask your caregiver for specific rehydration instructions.  Avoid:  Foods high in sugar.  Alcohol.  Carbonated drinks.  Tobacco.  Juice.  Caffeine drinks.  Extremely hot or cold fluids.  Fatty, greasy foods.  Too much intake of anything at one time.  Dairy products until 24 to 48 hours after diarrhea stops.  You may consume probiotics. Probiotics are active cultures of beneficial bacteria. They may lessen the amount and number of diarrheal stools in adults. Probiotics can be found in yogurt with active cultures and in supplements.  Wash your hands well to avoid spreading the virus.  Only take over-the-counter or prescription medicines for pain, discomfort, or fever as directed by your caregiver. Do not give aspirin to children. Antidiarrheal medicines are not recommended.  Ask your caregiver if you should continue to take your regular prescribed and over-the-counter medicines.  Keep all follow-up appointments as directed by your caregiver. SEEK IMMEDIATE MEDICAL CARE IF:   You are unable to keep fluids down.  You do not urinate at least once every 6 to 8 hours.  You develop shortness of breath.  You notice blood in your stool or vomit. This may look like coffee grounds.  You have abdominal pain that increases or is concentrated in one small area (localized).  You have persistent vomiting or diarrhea.  You have a fever.  The patient is a child younger than 3 months, and he or she has a fever.  The patient is a child older than 3 months, and he or she has a fever and persistent symptoms.  The patient is a child older than 3 months, and he or she has a fever and symptoms suddenly get worse.  The patient is a baby, and he or she has no tears when crying. MAKE SURE YOU:   Understand these instructions.  Will watch your condition.  Will get help right away if you are not doing well or get worse. Document Released: 02/16/2005 Document  Revised: 05/11/2011 Document Reviewed: 12/03/2010 [facility] Patient Information 2015 [place], [place]. This information is not intended to replace advice given to you by your health care provider. Make sure you discuss any questions you have with your health care provider.  Nausea and Vomiting Nausea means you feel sick to your stomach. Throwing up (vomiting) is a reflex where stomach contents come out of your mouth. HOME CARE   Take medicine as told by your doctor.  Do not force yourself to eat. However, you do need to drink fluids.  If you feel like eating, eat a normal diet as told by your doctor.  Eat rice, wheat, potatoes, bread, lean meats, yogurt, fruits, and vegetables.  Avoid high-fat foods.  Drink enough fluids to keep your pee (urine) clear or pale yellow.  Ask your doctor how to replace body fluid losses (rehydrate). Signs of body fluid loss (dehydration) include:  Feeling very thirsty.  Dry lips and mouth.  Feeling dizzy.  Dark pee.  Peeing less than normal.  Feeling confused.  Fast breathing or heart rate. GET HELP RIGHT AWAY IF:   You have blood in your throw up.  You have black or bloody poop (stool).  You have a bad headache or stiff neck.  You feel confused.  You have bad belly (abdominal) pain.  You have chest pain or trouble breathing.  You do not pee at least once every 8 hours.  You have cold, clammy skin.  You keep throwing up after 24 to 48 hours.  You have a fever. MAKE SURE YOU:   Understand these instructions.  Will watch your condition.  Will get help right away if you are not doing well or get worse. Document Released: 08/05/2007 Document Revised: 05/11/2011 Document Reviewed: 07/18/2010 [facility] Patient Information 2015 [place], [place]. This information is not intended to replace advice given to you by your health care provider. Make sure you discuss any questions you have with your health care provider.  Diarrhea Diarrhea  is watery poop (stool). It can make you feel weak, tired, thirsty, or give you a dry mouth (signs of dehydration). Watery poop is a sign of another problem, most often an infection. It often lasts 2-3 days. It can last longer if it is a sign of something serious. Take care of yourself as told by your doctor. HOME CARE   Drink 1 cup (8 ounces) of fluid each time you have watery poop.  Do not drink the following fluids:  Those that contain simple sugars (fructose, glucose, galactose, lactose, sucrose, maltose).  Sports drinks.  Fruit juices.  Whole milk products.  Sodas.  Drinks with caffeine (coffee, tea, soda) or alcohol.  Oral rehydration solution may be used if the doctor says it is okay. You may make your own solution. Follow this recipe:   - teaspoon table salt.   teaspoon baking soda.   teaspoon salt substitute containing potassium chloride.  1 tablespoons sugar.  1 liter (34 ounces) of water.  Avoid the following foods:  High fiber foods, such as raw fruits and vegetables.  Nuts, seeds, and whole grain breads and cereals.   Those that are sweetened with sugar alcohols (xylitol, sorbitol, mannitol).  Try eating the following foods:  Starchy foods, such as rice, toast, pasta, low-sugar cereal, oatmeal, baked potatoes, crackers, and bagels.  Bananas.  Applesauce.  Eat probiotic-rich foods, such as yogurt and milk products that are fermented.  Wash your hands well after each time you have watery poop.  Only take medicine as told by your doctor.  Take a warm bath to help lessen burning or pain from having watery poop. GET HELP RIGHT AWAY IF:   You cannot drink fluids without throwing up (vomiting).  You keep throwing up.  You have blood in your poop, or your poop looks black and tarry.  You do not pee (urinate) in 6-8 hours, or there is only a small amount of very dark pee.  You have belly (abdominal) pain that gets worse or stays in the same spot  (localizes).  You are weak, dizzy, confused, or light-headed.  You have a very bad headache.  Your watery poop gets worse or does not get better.  You have a fever or lasting symptoms for more than 2-3 days.  You have a fever and your symptoms suddenly get worse. MAKE SURE YOU:   Understand these instructions.  Will watch your condition.  Will get help right away if you are not doing well or get worse. Document Released: 08/05/2007 Document Revised: 07/03/2013 Document Reviewed: 10/25/2011 [facility] Patient Information 2015 [place], [place]. This information is not intended to replace advice given to you by your health care provider. Make sure you discuss any questions you have with your health care provider.  Dehydration, Adult Dehydration means your body does not have as much  fluid as it needs. Your kidneys, brain, and heart will not work properly without the right amount of fluids and salt.  HOME CARE  Ask your doctor how to replace body fluid losses (rehydrate).  Drink enough fluids to keep your pee (urine) clear or pale yellow.  Drink small amounts of fluids often if you feel sick to your stomach (nauseous) or throw up (vomit).  Eat like you normally do.  Avoid:  Foods or drinks high in sugar.  Bubbly (carbonated) drinks.  Juice.  Very hot or cold fluids.  Drinks with caffeine.  Fatty, greasy foods.  Alcohol.  Tobacco.  Eating too much.  Gelatin desserts.  Wash your hands to avoid spreading germs (bacteria, viruses).  Only take medicine as told by your doctor.  Keep all doctor visits as told. GET HELP RIGHT AWAY IF:   You cannot drink something without throwing up.  You get worse even with treatment.  Your vomit has blood in it or looks greenish.  Your poop (stool) has blood in it or looks black and tarry.  You have not peed in 6 to 8 hours.  You pee a small amount of very dark pee.  You have a fever.  You pass out (faint).  You have  belly (abdominal) pain that gets worse or stays in one spot (localizes).  You have a rash, stiff neck, or bad headache.  You get easily annoyed, sleepy, or are hard to wake up.  You feel weak, dizzy, or very thirsty. MAKE SURE YOU:   Understand these instructions.  Will watch your condition.  Will get help right away if you are not doing well or get worse. Document Released: 12/13/2008 Document Revised: 05/11/2011 Document Reviewed: 10/06/2010 [facility] Patient Information 2015 [place], [place]. This information is not intended to replace advice given to you by your health care provider. Make sure you discuss any questions you have with your health care provider.  Food Poisoning Food poisoning is an illness caused by something you ate or drank. There are over 250 known causes of food poisoning. However, many other causes are unknown.You can be treated even if the exact cause of your food poisoning is not known. In most cases, food poisoning is mild and lasts 1 to 2 days. However, some cases can be serious, especially for people with low immune systems, the elderly, children and infants, and pregnant women. CAUSES  Poor personal hygiene, improper cleaning of storage and preparation areas, and unclean utensils can cause infection or tainting (contamination) of foods. The causes of food poisoning are numerous.Infectious agents, such as viruses, bacteria, or parasites, can cause harm by infecting the intestine and disrupting the absorption of nutrients and water. This can cause diarrhea and lead to dehydration. Viruses are responsible for most of the food poisonings in which an agent is found. Parasites are less likely to cause food poisoning. Toxic agents, such as poisonous mushrooms, marine algae, and pesticides can also cause food poisoning.  Viral causes of food poisoning include:  Norovirus.  Rotavirus.  Hepatitis A.  Bacterial causes of food poisoning  include:  Salmonellae.  Campylobacter.  Bacillus cereus.  Escherichia coli (E. coli).  Shigella.  Listeria monocytogenes.  Clostridium botulinum (botulism).  Vibrio cholerae.  Parasites that can cause food poisoning include:  Giardia.  Cryptosporidium.  Toxoplasma. SYMPTOMS Symptoms may appear several hours or longer after consuming the contaminated food or drink. Symptoms may include:  Nausea.  Vomiting.  Cramping.  Diarrhea.  Fever and chills.  Muscle  aches. DIAGNOSIS Your health care provider may be able to diagnose food poisoning from a list of what you have recently eaten and results from lab tests. Diagnostic tests may include an exam of the feces. TREATMENT In most cases, treatment focuses on helping to relieve your symptoms and staying well hydrated. Antibiotic medicines are rarely needed. In severe cases, hospitalization may be required. HOME CARE INSTRUCTIONS   Drink enough water and fluids to keep your urine clear or pale yellow. Drink small amounts of fluids frequently and increase as tolerated.  Ask your health care provider for specific rehydration instructions.  Avoid:  Foods high in sugar.  Alcohol.  Carbonated drinks.  Tobacco.  Juice.  Caffeine drinks.  Extremely hot or cold fluids.  Fatty, greasy foods.  Too much intake of anything at one time.  Dairy products until 24 to 48 hours after diarrhea stops.  You may consume probiotics. Probiotics are active cultures of beneficial bacteria. They may lessen the amount and number of diarrheal stools in adults. Probiotics can be found in yogurt with active cultures and in supplements.  Wash your hands well to avoid spreading the bacteria.  Take medicines only as directed by your health care provider. Do not give your child aspirin because of the association with Reye's syndrome.  Ask your health care provider if you should continue to take your regular prescribed and  over-the-counter medicines. PREVENTION   Wash your hands, food preparation surfaces, and utensils thoroughly before and after handling raw foods.  Keep refrigerated foods below 47F (5C).  Serve hot foods immediately or keep them heated above 147F (60C).  Divide large volumes of food into small portions for rapid cooling in the refrigerator. Hot, bulky foods in the refrigerator can raise the temperature of other foods that have already cooled.  Follow approved canning procedures.  Heat canned foods thoroughly before tasting.  When in doubt, throw it out.  Infants, the elderly, women who are pregnant, and people with compromised immune systems are especially susceptible to food poisoning. These people should never consume unpasteurized cheese, unpasteurized cider, raw fish, raw seafood, or raw meat-type products. SEEK IMMEDIATE MEDICAL CARE IF:   You have difficulty breathing, swallowing, talking, or moving.  You develop blurred vision.  You are unable to keep fluids down.  You faint or nearly faint.  Your eyes turn yellow.  Vomiting or diarrhea develops or becomes persistent.  Abdominal pain develops, increases, or localizes in one small area.  You have a fever.  The diarrhea becomes excessive or contains blood or mucus.  You develop excessive weakness, dizziness, or extreme thirst.  You have no urine for 8 hours. MAKE SURE YOU:   Understand these instructions.  Will watch your condition.  Will get help right away if you are not doing well or get worse. Document Released: 11/15/2003 Document Revised: 07/03/2013 Document Reviewed: 07/03/2010 [facility] Patient Information 2015 [place], [place]. This information is not intended to replace advice given to you by your health care provider. Make sure you discuss any questions you have with your health care provider.  Hypokalemia Hypokalemia means that the amount of potassium in the blood is lower than normal.Potassium  is a chemical, called an electrolyte, that helps regulate the amount of fluid in the body. It also stimulates muscle contraction and helps nerves function properly.Most of the body's potassium is inside of cells, and only a very small amount is in the blood. Because the amount in the blood is so small, minor changes can  be life-threatening. CAUSES  Antibiotics.  Diarrhea or vomiting.  Using laxatives too much, which can cause diarrhea.  Chronic kidney disease.  Water pills (diuretics).  Eating disorders (bulimia).  Low magnesium level.  Sweating a lot. SIGNS AND SYMPTOMS  Weakness.  Constipation.  Fatigue.  Muscle cramps.  Mental confusion.  Skipped heartbeats or irregular heartbeat (palpitations).  Tingling or numbness. DIAGNOSIS  Your health care provider can diagnose hypokalemia with blood tests. In addition to checking your potassium level, your health care provider may also check other lab tests. TREATMENT Hypokalemia can be treated with potassium supplements taken by mouth or adjustments in your current medicines. If your potassium level is very low, you may need to get potassium through a vein (IV) and be monitored in the hospital. A diet high in potassium is also helpful. Foods high in potassium are:  Nuts, such as peanuts and pistachios.  Seeds, such as sunflower seeds and pumpkin seeds.  Peas, lentils, and lima beans.  Whole grain and bran cereals and breads.  Fresh fruit and vegetables, such as apricots, avocado, bananas, cantaloupe, kiwi, oranges, tomatoes, asparagus, and potatoes.  Orange and tomato juices.  Red meats.  Fruit yogurt. HOME CARE INSTRUCTIONS  Take all medicines as prescribed by your health care provider.  Maintain a healthy diet by including nutritious food, such as fruits, vegetables, nuts, whole grains, and lean meats.  If you are taking a laxative, be sure to follow the directions on the label. SEEK MEDICAL CARE IF:  Your  weakness gets worse.  You feel your heart pounding or racing.  You are vomiting or having diarrhea.  You are diabetic and having trouble keeping your blood glucose in the normal range. SEEK IMMEDIATE MEDICAL CARE IF:  You have chest pain, shortness of breath, or dizziness.  You are vomiting or having diarrhea for more than 2 days.  You faint. MAKE SURE YOU:   Understand these instructions.  Will watch your condition.  Will get help right away if you are not doing well or get worse. Document Released: 02/16/2005 Document Revised: 12/07/2012 Document Reviewed: 08/19/2012 [facility] Patient Information 2015 [place], [place]. This information is not intended to replace advice given to you by your health care provider. Make sure you discuss any questions you have with your health care provider.  Potassium Content of Foods Potassium is a mineral found in many foods and drinks. It helps keep fluids and minerals balanced in your body and affects how steadily your heart beats. Potassium also helps control your blood pressure and keep your muscles and nervous system healthy. Certain health conditions and medicines may change the balance of potassium in your body. When this happens, you can help balance your level of potassium through the foods that you do or do not eat. Your health care provider or dietitian may recommend an amount of potassium that you should have each day. The following lists of foods provide the amount of potassium (in parentheses) per serving in each item. HIGH IN POTASSIUM  The following foods and beverages have 200 mg or more of potassium per serving:  Apricots, 2 raw or 5 dry (200 mg).  Artichoke, 1 medium (345 mg).  Avocado, raw,  each (245 mg).  Banana, 1 medium (425 mg).  Beans, lima, or baked beans, canned,  cup (280 mg).  Beans, white, canned,  cup (595 mg).  Beef roast, 3 oz (320 mg).  Beef, ground, 3 oz (270 mg).  Beets, raw or cooked,  cup (260  mg).  Bran muffin, 2 oz (300 mg).  Broccoli,  cup (230 mg).  Brussels sprouts,  cup (250 mg).  Cantaloupe,  cup (215 mg).  Cereal, 100% bran,  cup (200-400 mg).  Cheeseburger, single, fast food, 1 each (225-400 mg).  Chicken, 3 oz (220 mg).  Clams, canned, 3 oz (535 mg).  Crab, 3 oz (225 mg).  Dates, 5 each (270 mg).  Dried beans and peas,  cup (300-475 mg).  Figs, dried, 2 each (260 mg).  Fish: halibut, tuna, cod, snapper, 3 oz (480 mg).  Fish: salmon, haddock, swordfish, perch, 3 oz (300 mg).  Fish, tuna, canned 3 oz (200 mg).  [place] fries, fast food, 3 oz (470 mg).  Granola with fruit and nuts,  cup (200 mg).  Grapefruit juice,  cup (200 mg).  Greens, beet,  cup (655 mg).  Honeydew melon,  cup (200 mg).  Kale, raw, 1 cup (300 mg).  Kiwi, 1 medium (240 mg).  Kohlrabi, rutabaga, parsnips,  cup (280 mg).  Lentils,  cup (365 mg).  Mango, 1 each (325 mg).  Milk, chocolate, 1 cup (420 mg).  Milk: nonfat, low-fat, whole, buttermilk, 1 cup (350-380 mg).  Molasses, 1 Tbsp (295 mg).  Mushrooms,  cup (280) mg.  Nectarine, 1 each (275 mg).  Nuts: almonds, peanuts, hazelnuts, [place], cashew, mixed, 1 oz (200 mg).  Nuts, pistachios, 1 oz (295 mg).  Orange, 1 each (240 mg).  Orange juice,  cup (235 mg).  Papaya, medium,  fruit (390 mg).  Peanut butter, chunky, 2 Tbsp (240 mg).  Peanut butter, smooth, 2 Tbsp (210 mg).  Pear, 1 medium (200 mg).  Pomegranate, 1 whole (400 mg).  Pomegranate juice,  cup (215 mg).  Pork, 3 oz (350 mg).  Potato chips, salted, 1 oz (465 mg).  Potato, baked with skin, 1 medium (925 mg).  Potatoes, boiled,  cup (255 mg).  Potatoes, mashed,  cup (330 mg).  Prune juice,  cup (370 mg).  Prunes, 5 each (305 mg).  Pudding, chocolate,  cup (230 mg).  Pumpkin, canned,  cup (250 mg).  Raisins, seedless,  cup (270 mg).  Seeds, sunflower or pumpkin, 1 oz (240 mg).  Soy milk, 1 cup (300  mg).  Spinach,  cup (420 mg).  Spinach, canned,  cup (370 mg).  Sweet potato, baked with skin, 1 medium (450 mg).  Swiss chard,  cup (480 mg).  Tomato or vegetable juice,  cup (275 mg).  Tomato sauce or puree,  cup (400-550 mg).  Tomato, raw, 1 medium (290 mg).  Tomatoes, canned,  cup (200-300 mg).  [place], 3 oz (250 mg).  Wheat germ, 1 oz (250 mg).  Winter squash,  cup (250 mg).  Yogurt, plain or fruited, 6 oz (260-435 mg).  Zucchini,  cup (220 mg). MODERATE IN POTASSIUM The following foods and beverages have 50-200 mg of potassium per serving:  Apple, 1 each (150 mg).  Apple juice,  cup (150 mg).  Applesauce,  cup (90 mg).  Apricot nectar,  cup (140 mg).  Asparagus, small spears,  cup or 6 spears (155 mg).  Bagel, cinnamon raisin, 1 each (130 mg).  Bagel, egg or plain, 4 in., 1 each (70 mg).  Beans, green,  cup (90 mg).  Beans, yellow,  cup (190 mg).  Beer, regular, 12 oz (100 mg).  Beets, canned,  cup (125 mg).  Blackberries,  cup (115 mg).  Blueberries,  cup (60 mg).  Bread, whole wheat, 1 slice (70 mg).  Broccoli, raw,  cup (145 mg).  Cabbage,  cup (150 mg).  Carrots, cooked or raw,  cup (180 mg).  Cauliflower, raw,  cup (150 mg).  Celery, raw,  cup (155 mg).  Cereal, bran flakes, cup (120-150 mg).  Cheese, cottage,  cup (110 mg).  Cherries, 10 each (150 mg).  Chocolate, 1 oz bar (165 mg).  Coffee, brewed 6 oz (90 mg).  Corn,  cup or 1 ear (195 mg).  Cucumbers,  cup (80 mg).  Egg, large, 1 each (60 mg).  Eggplant,  cup (60 mg).  Endive, raw, cup (80 mg).  English muffin, 1 each (65 mg).  Fish, orange roughy, 3 oz (150 mg).  Frankfurter, beef or pork, 1 each (75 mg).  Fruit cocktail,  cup (115 mg).  Grape juice,  cup (170 mg).  Grapefruit,  fruit (175 mg).  Grapes,  cup (155 mg).  Greens: kale, turnip, collard,  cup (110-150 mg).  Ice cream or frozen yogurt, chocolate,  cup  (175 mg).  Ice cream or frozen yogurt, vanilla,  cup (120-150 mg).  Lemons, limes, 1 each (80 mg).  Lettuce, all types, 1 cup (100 mg).  Mixed vegetables,  cup (150 mg).  Mushrooms, raw,  cup (110 mg).  Nuts: walnuts, pecans, or macadamia, 1 oz (125 mg).  Oatmeal,  cup (80 mg).  Okra,  cup (110 mg).  Onions, raw,  cup (120 mg).  Peach, 1 each (185 mg).  Peaches, canned,  cup (120 mg).  Pears, canned,  cup (120 mg).  Peas, green, frozen,  cup (90 mg).  Peppers, green,  cup (130 mg).  Peppers, red,  cup (160 mg).  Pineapple juice,  cup (165 mg).  Pineapple, fresh or canned,  cup (100 mg).  Plums, 1 each (105 mg).  Pudding, vanilla,  cup (150 mg).  Raspberries,  cup (90 mg).  Rhubarb,  cup (115 mg).  Rice, wild,  cup (80 mg).  Shrimp, 3 oz (155 mg).  Spinach, raw, 1 cup (170 mg).  Strawberries,  cup (125 mg).  Summer squash  cup (175-200 mg).  Swiss chard, raw, 1 cup (135 mg).  Tangerines, 1 each (140 mg).  Tea, brewed, 6 oz (65 mg).  Turnips,  cup (140 mg).  Watermelon,  cup (85 mg).  Wine, red, table, 5 oz (180 mg).  Wine, white, table, 5 oz (100 mg). LOW IN POTASSIUM The following foods and beverages have less than 50 mg of potassium per serving.  Bread, white, 1 slice (30 mg).  Carbonated beverages, 12 oz (less than 5 mg).  Cheese, 1 oz (20-30 mg).  Cranberries,  cup (45 mg).  Cranberry juice cocktail,  cup (20 mg).  Fats and oils, 1 Tbsp (less than 5 mg).  Hummus, 1 Tbsp (32 mg).  Nectar: papaya, mango, or pear,  cup (35 mg).  Rice, white or brown,  cup (50 mg).  Spaghetti or macaroni,  cup cooked (30 mg).  Tortilla, flour or corn, 1 each (50 mg).  Waffle, 4 in., 1 each (50 mg).  Water chestnuts,  cup (40 mg). Document Released: 09/30/2004 Document Revised: 02/21/2013 Document Reviewed: 01/13/2013 [facility] Patient Information 2015 [place], [place]. This information is not intended to replace  advice given to you by your health care provider. Make sure you discuss any questions you have with your health care provider.

## 2014-09-19 NOTE — ED Notes (Addendum)
nvd x 3 days, bouts of vomiting a diarrhea q 2 hours, husband here with same thing.  Lower abd pain as well, reports fever although afebrile at triage.  States she ate some beef before this started and her husband ate it too, they developed symptoms at same time.  Not tolerating solids or liquids

## 2014-09-19 NOTE — ED Notes (Signed)
Pt placed in a gown and hooked up to the monitor with the BP cuff and pulse ox 

## 2014-09-21 LAB — GI PATHOGEN PANEL BY PCR, STOOL
C difficile toxin A/B: NOT DETECTED
CRYPTOSPORIDIUM BY PCR: NOT DETECTED
Campylobacter by PCR: NOT DETECTED
E COLI (STEC): NOT DETECTED
E COLI 0157 BY PCR: NOT DETECTED
E coli (ETEC) LT/ST: NOT DETECTED
G lamblia by PCR: NOT DETECTED
Norovirus GI/GII: NOT DETECTED
Rotavirus A by PCR: NOT DETECTED
Shigella by PCR: NOT DETECTED

## 2015-08-14 ENCOUNTER — Encounter (HOSPITAL_COMMUNITY): Payer: Self-pay

## 2015-08-14 DIAGNOSIS — Z5321 Procedure and treatment not carried out due to patient leaving prior to being seen by health care provider: Secondary | ICD-10-CM | POA: Diagnosis not present

## 2015-08-14 DIAGNOSIS — R200 Anesthesia of skin: Secondary | ICD-10-CM | POA: Insufficient documentation

## 2015-08-14 NOTE — ED Notes (Signed)
The pt is c/o some facial numbness both sides of the face earlier today  Then she had rt arm from the  [place] down.  The numbness in her rt arm is getting better  lmp none  iud

## 2015-08-14 NOTE — ED Notes (Signed)
All notes and assessments done by chris c not amy p nt  Also acuity   Bu chris rn not nt amy p

## 2015-08-15 ENCOUNTER — Emergency Department (HOSPITAL_COMMUNITY)
Admission: EM | Admit: 2015-08-15 | Discharge: 2015-08-15 | Disposition: A | Discharge status: Home / Self Care | Payer: Medicaid Other | Attending: Dermatology | Admitting: Dermatology

## 2015-08-15 NOTE — ED Notes (Signed)
No answer when called to go back to a room

## 2016-07-08 ENCOUNTER — Telehealth (HOSPITAL_COMMUNITY): Payer: Self-pay | Admitting: *Deleted

## 2016-07-08 NOTE — Telephone Encounter (Signed)
Telephoned patient at home number and left message to return call to [facility] 

## 2016-07-24 ENCOUNTER — Other Ambulatory Visit (HOSPITAL_COMMUNITY): Payer: Self-pay | Admitting: *Deleted

## 2016-07-24 DIAGNOSIS — N6310 Unspecified lump in the right breast, unspecified quadrant: Secondary | ICD-10-CM

## 2016-07-24 DIAGNOSIS — N644 Mastodynia: Secondary | ICD-10-CM

## 2016-08-06 ENCOUNTER — Other Ambulatory Visit: Payer: Medicaid Other

## 2016-08-06 ENCOUNTER — Ambulatory Visit (HOSPITAL_COMMUNITY): Payer: Medicaid Other

## 2016-08-17 ENCOUNTER — Other Ambulatory Visit (HOSPITAL_COMMUNITY): Payer: Self-pay | Admitting: *Deleted

## 2016-08-27 ENCOUNTER — Ambulatory Visit (HOSPITAL_COMMUNITY)
Admission: RE | Admit: 2016-08-27 | Discharge: 2016-08-27 | Disposition: A | Discharge status: Home / Self Care | Payer: Self-pay | Source: Ambulatory Visit | Attending: Obstetrics and Gynecology | Admitting: Obstetrics and Gynecology

## 2016-08-27 ENCOUNTER — Ambulatory Visit
Admission: RE | Admit: 2016-08-27 | Discharge: 2016-08-27 | Disposition: A | Discharge status: Home / Self Care | Payer: No Typology Code available for payment source | Source: Ambulatory Visit | Attending: Obstetrics and Gynecology | Admitting: Obstetrics and Gynecology

## 2016-08-27 DIAGNOSIS — N6452 Nipple discharge: Secondary | ICD-10-CM

## 2016-08-27 DIAGNOSIS — N644 Mastodynia: Secondary | ICD-10-CM

## 2016-08-27 DIAGNOSIS — N6310 Unspecified lump in the right breast, unspecified quadrant: Secondary | ICD-10-CM

## 2016-08-27 DIAGNOSIS — Z1239 Encounter for other screening for malignant neoplasm of breast: Secondary | ICD-10-CM

## 2016-08-27 DIAGNOSIS — N6311 Unspecified lump in the right breast, upper outer quadrant: Secondary | ICD-10-CM

## 2016-08-27 NOTE — Progress Notes (Signed)
Complaints of right breast lump x 2-3 months. Patient complained of bilateral breast pain that is greater within her right breast and milky discharge when expresses. Patient rates pain at a 7-8.   Pap Smear: Pap smear not completed today. [facility] and normal. Per patient has no history of an abnormal Pap smear. Last Pap smear report will be scanned into EPIC.  Physical exam: Breasts Breasts symmetrical. No skin abnormalities bilateral breasts. No nipple retraction bilateral breasts. No nipple discharge left breast. Clear colored nipple discharge expressed from right breast on exam. Sample of right breast sent to cytology for evaluation. Unable to express nipple discharge from left breast on exam. No lymphadenopathy. No lumps palpated left breast. Palpated a mobile pea sized lump within the right breast at 10 o'clock 12 cm from the nipple. Complaints of tenderness when palpated right breast lump on exam. [facility] and possible right breast ultrasound. Appointment scheduled for Thursday, August 27, 2016 at 0850.     Pelvic/Bimanual No Pap smear completed today since last Pap smear was 12/21/2014. Pap smear not indicated per [facility] guidelines.   Smoking History: Patient has never smoked.  Patient Navigation: Patient education provided. [facility] services [facility]. Spanish interpreter provided.   Used Spanish interpreter [employer]rika McReynolds from [facility].

## 2016-08-27 NOTE — Patient Instructions (Signed)
Explained breast self awareness with Jackie CroakMaria Guadalupe Cooper. Patient did not need a Pap smear today due to last Pap smear was 12/21/2014. Let her know [facility] will cover Pap smears every 3 years unless has a history of abnormal Pap smears. [facility] mamm[facility] and possible right breast ultrasound. Appointment scheduled for Thursday, August 27, 2016 at 0850. [name] verbalized understanding.  Dakayla Disanti, Kathaleen Maserhristine Poll, RN 9:26 AM

## 2016-08-27 NOTE — Addendum Note (Signed)
Encounter addended by: Lynnell DikeHolland, Marquavius Scaife H, LPN on: 1/61/09606/28/2018 10:45 AM<BR>    Actions taken: Order list changed

## 2016-08-28 ENCOUNTER — Encounter (HOSPITAL_COMMUNITY): Payer: Self-pay | Admitting: *Deleted

## 2016-08-31 ENCOUNTER — Telehealth (HOSPITAL_COMMUNITY): Payer: Self-pay | Admitting: *Deleted

## 2016-08-31 NOTE — Telephone Encounter (Signed)
Telephoned patient at home number and advised patient of negative breast discharge results. Patient voiced understanding. Used interpreter Delorise RoyalsJulie Sowell.

## 2018-09-09 ENCOUNTER — Ambulatory Visit: Payer: No Typology Code available for payment source | Admitting: Internal Medicine

## 2018-09-09 ENCOUNTER — Ambulatory Visit: Payer: Self-pay | Attending: Internal Medicine | Admitting: Internal Medicine

## 2018-09-09 ENCOUNTER — Other Ambulatory Visit: Payer: Self-pay

## 2018-09-27 ENCOUNTER — Encounter: Payer: Self-pay | Admitting: Nurse Practitioner

## 2018-09-27 ENCOUNTER — Other Ambulatory Visit: Payer: Self-pay

## 2018-09-27 ENCOUNTER — Ambulatory Visit: Payer: Self-pay | Attending: Nurse Practitioner | Admitting: Nurse Practitioner

## 2018-09-27 DIAGNOSIS — R6889 Other general symptoms and signs: Secondary | ICD-10-CM

## 2018-09-27 DIAGNOSIS — Z833 Family history of diabetes mellitus: Secondary | ICD-10-CM

## 2018-09-27 DIAGNOSIS — Z13220 Encounter for screening for lipoid disorders: Secondary | ICD-10-CM

## 2018-09-27 DIAGNOSIS — R5382 Chronic fatigue, unspecified: Secondary | ICD-10-CM

## 2018-09-27 DIAGNOSIS — Z130 Encounter for screening for diseases of the blood and blood-forming organs and certain disorders involving the immune mechanism: Secondary | ICD-10-CM

## 2018-09-27 NOTE — Progress Notes (Signed)
Virtual Visit via Telephone Note Due to national recommendations of social distancing due to [place] 19, telehealth visit is felt to be most appropriate for this patient at this time.  I discussed the limitations, risks, security and privacy concerns of performing an evaluation and management service by telephone and the availability of in person appointments. I also discussed with the patient that there may be a patient responsible charge related to this service. The patient expressed understanding and agreed to proceed.    I connected with Jackie Cooper on 09/27/18  at   1:50 PM EDT  EDT by telephone and verified that I am speaking with the correct person using two identifiers.   Consent I discussed the limitations, risks, security and privacy concerns of performing an evaluation and management service by telephone and the availability of in person appointments. I also discussed with the patient that there may be a patient responsible charge related to this service. The patient expressed understanding and agreed to proceed.   Location of Patient: Private Residence   Location of Provider: [place] participating in Telemedicine visit: Geryl Rankins FNP-BC [place] Interpreter ID# [phone]   History of Present Illness: Telemedicine visit for: Establish care  She is requesting to receive hormone medication as she gets cold when she gets her menstrual cycles. I have explained to her that I do not prescribe hormones and will perform testing to determine if she has anemia or any thyroid disorder.  She has a history of GAD, panic disorder, stress and fatigue. Has taken hydroxyzine 50 mg QID prn in the past. She was instructed to take vitamin B complex daily as well for fatigue. Previous labs do not show anemia.   She has no other issues or concerns today. Will schedule physical and  PAP.    Past Medical History:  Diagnosis Date  . Anxiety   . Depression    pp depression  . Panic disorder   . Varicose veins     Past Surgical History:  Procedure Laterality Date  . CESAREAN SECTION      Family History  Problem Relation Age of Onset  . Diabetes Maternal Grandmother   . Cancer Maternal Grandmother     Social History   Socioeconomic History  . Marital status: Married    Spouse name: Not on file  . Number of children: Not on file  . Years of education: Not on file  . Highest education level: Not on file  Occupational History  . Not on file  Social Needs  . Financial resource strain: Not on file  . Food insecurity    Worry: Not on file    Inability: Not on file  . Transportation needs    Medical: Not on file    Non-medical: Not on file  Tobacco Use  . Smoking status: Never Smoker  . Smokeless tobacco: Never Used  Substance and Sexual Activity  . Alcohol use: No  . Drug use: No  . Sexual activity: Yes    Birth control/protection: None  Lifestyle  . Physical activity    Days per week: Not on file    Minutes per session: Not on file  . Stress: Not on file  Relationships  . Social Herbalist on phone: Not on file    Gets together: Not on file    Attends religious service: Not on file    Active member  of club or organization: Not on file    Attends meetings of clubs or organizations: Not on file    Relationship status: Not on file  Other Topics Concern  . Not on file  Social History Narrative  . Not on file     Observations/Objective: Awake, alert and oriented x 3   Review of Systems  Constitutional: Negative for fever, malaise/fatigue and weight loss.  HENT: Negative.  Negative for nosebleeds.   Eyes: Negative.  Negative for blurred vision, double vision and photophobia.  Respiratory: Negative.  Negative for cough and shortness of breath.   Cardiovascular: Negative.  Negative for chest pain, palpitations and leg swelling.   Gastrointestinal: Negative.  Negative for heartburn, nausea and vomiting.  Musculoskeletal: Negative.  Negative for myalgias.  Neurological: Negative.  Negative for dizziness, focal weakness, seizures and headaches.  Psychiatric/Behavioral: Negative.  Negative for suicidal ideas.    Assessment and Plan: Jackie HesselbachMaria was seen today for new patient (initial visit).  Diagnoses and all orders for this visit:  Sensation of change in body temperature -     TSH; Future  Encounter for screening for lipid disorder -     Lipid panel; Future  Screening for deficiency anemia -     CBC; Future -     Basic metabolic panel; Future  Family history of diabetes mellitus in maternal grandmother -     Hemoglobin A1c; Future     Follow Up Instructions Return for Physical and PAP.     I discussed the assessment and treatment plan with the patient. The patient was provided an opportunity to ask questions and all were answered. The patient agreed with the plan and demonstrated an understanding of the instructions.   The patient was advised to call back or seek an in-person evaluation if the symptoms worsen or if the condition fails to improve as anticipated.  I provided 19 minutes of non-face-to-face time during this encounter including median intraservice time, reviewing previous notes, labs, imaging, medications and explaining diagnosis and management.  Claiborne RiggZelda W Issabela Lesko, FNP-BC

## 2018-10-24 ENCOUNTER — Encounter: Payer: No Typology Code available for payment source | Admitting: Nurse Practitioner

## 2021-04-16 ENCOUNTER — Other Ambulatory Visit: Payer: Self-pay | Admitting: Obstetrics & Gynecology

## 2021-04-16 DIAGNOSIS — Z1231 Encounter for screening mammogram for malignant neoplasm of breast: Secondary | ICD-10-CM

## 2021-05-06 ENCOUNTER — Ambulatory Visit
Admission: RE | Admit: 2021-05-06 | Discharge: 2021-05-06 | Disposition: A | Discharge status: Home / Self Care | Payer: No Typology Code available for payment source | Source: Ambulatory Visit | Attending: Obstetrics & Gynecology | Admitting: Obstetrics & Gynecology

## 2021-05-06 ENCOUNTER — Other Ambulatory Visit: Payer: Self-pay

## 2021-05-06 DIAGNOSIS — Z1231 Encounter for screening mammogram for malignant neoplasm of breast: Secondary | ICD-10-CM

## 2022-05-26 ENCOUNTER — Other Ambulatory Visit: Payer: Self-pay | Admitting: Obstetrics & Gynecology

## 2022-05-26 DIAGNOSIS — Z1231 Encounter for screening mammogram for malignant neoplasm of breast: Secondary | ICD-10-CM

## 2022-06-02 ENCOUNTER — Ambulatory Visit
Admission: RE | Admit: 2022-06-02 | Discharge: 2022-06-02 | Disposition: A | Discharge status: Home / Self Care | Payer: No Typology Code available for payment source | Source: Ambulatory Visit | Attending: Obstetrics & Gynecology | Admitting: Obstetrics & Gynecology

## 2022-06-02 DIAGNOSIS — Z1231 Encounter for screening mammogram for malignant neoplasm of breast: Secondary | ICD-10-CM

## 2023-05-24 IMAGING — MG MM DIGITAL SCREENING BILAT W/ TOMO AND CAD
6 of 10 series · 6 of 30 positions shown · non-contrast
Comparison: Previous exam(s).

CLINICAL DATA: Screening.

EXAM:
DIGITAL SCREENING BILATERAL MAMMOGRAM WITH TOMOSYNTHESIS AND CAD
TECHNIQUE: Bilateral screening digital craniocaudal and mediolateral oblique
mammograms were obtained. Bilateral screening digital breast
tomosynthesis was performed. The images were evaluated with
computer-aided detection.

[R CC synth-2D]
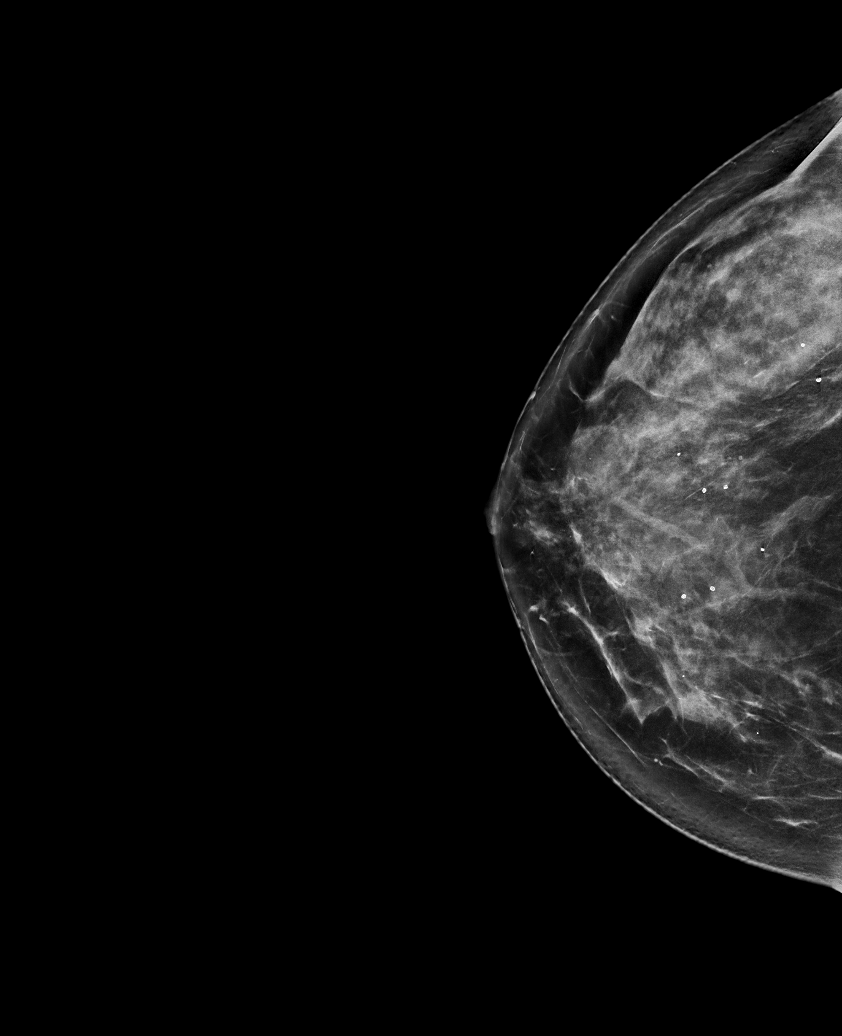

[L MLO synth-2D]
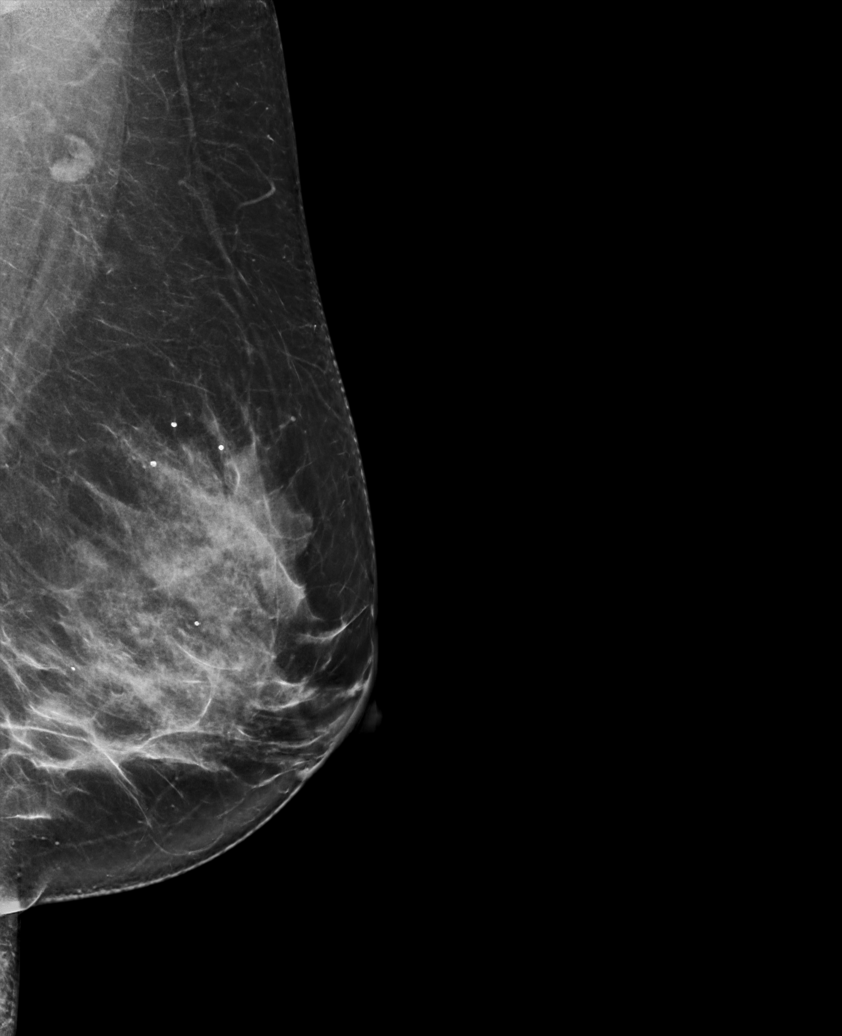

[R MLO synth-2D]
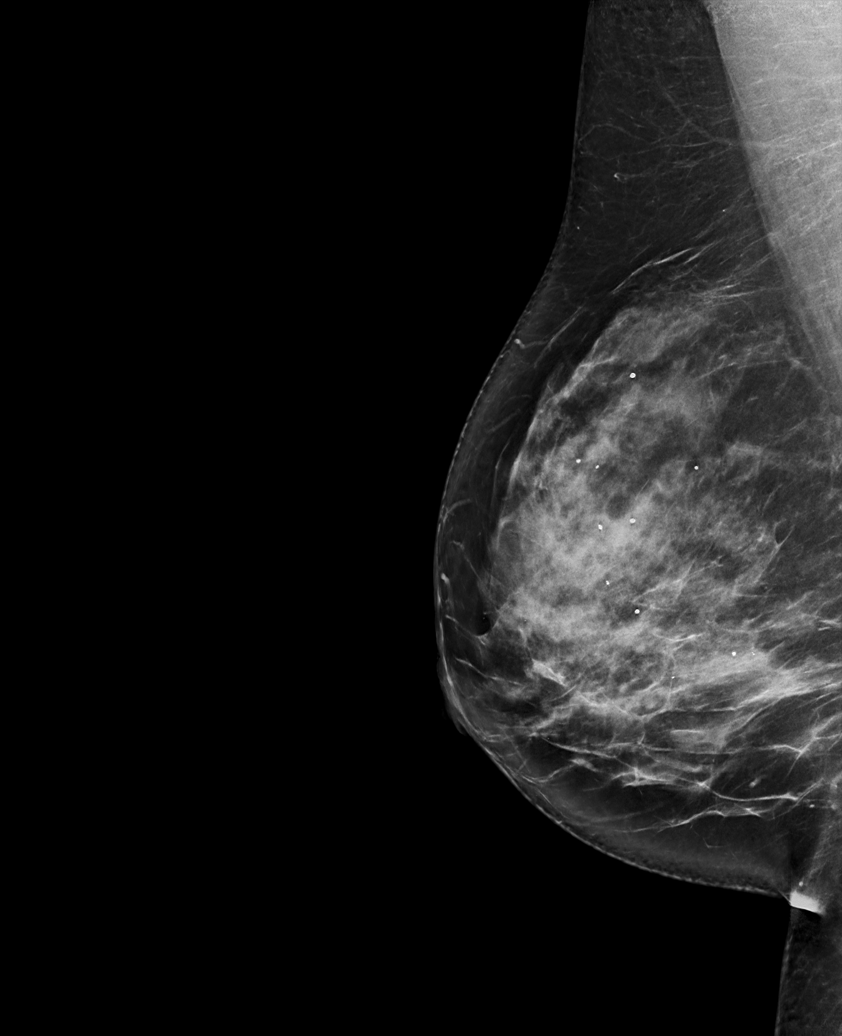

[L CC synth-2D]
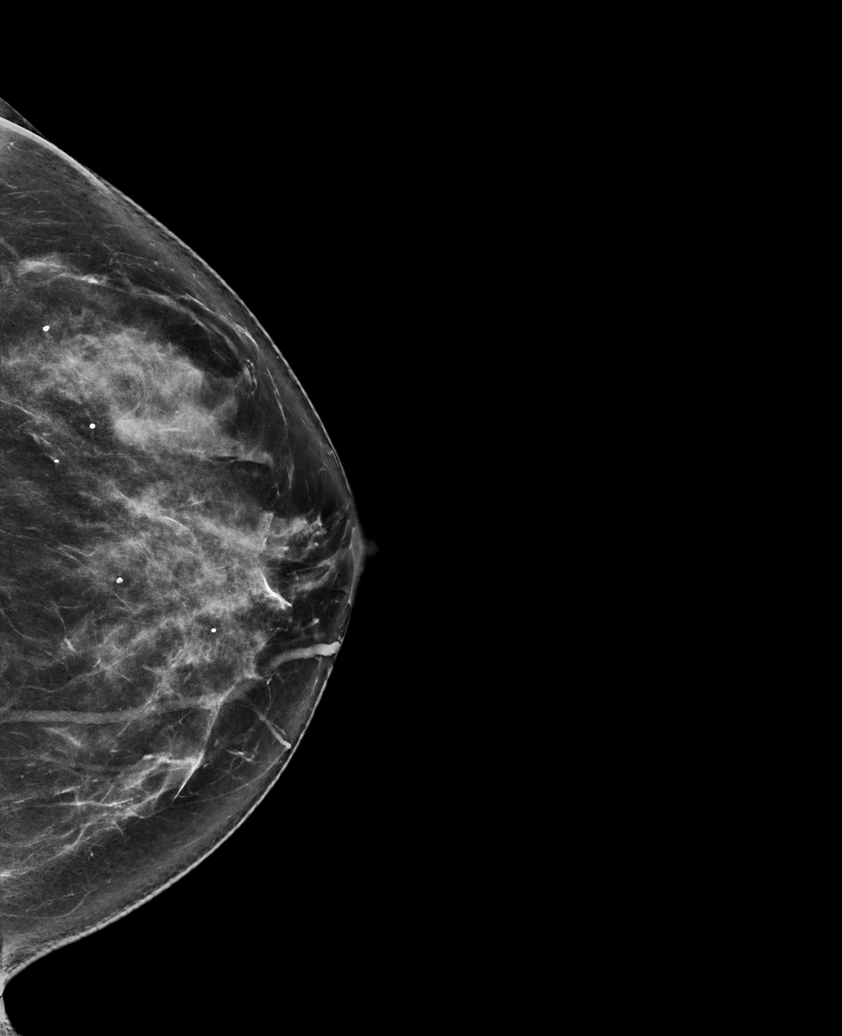

[R XCCL synth-2D]
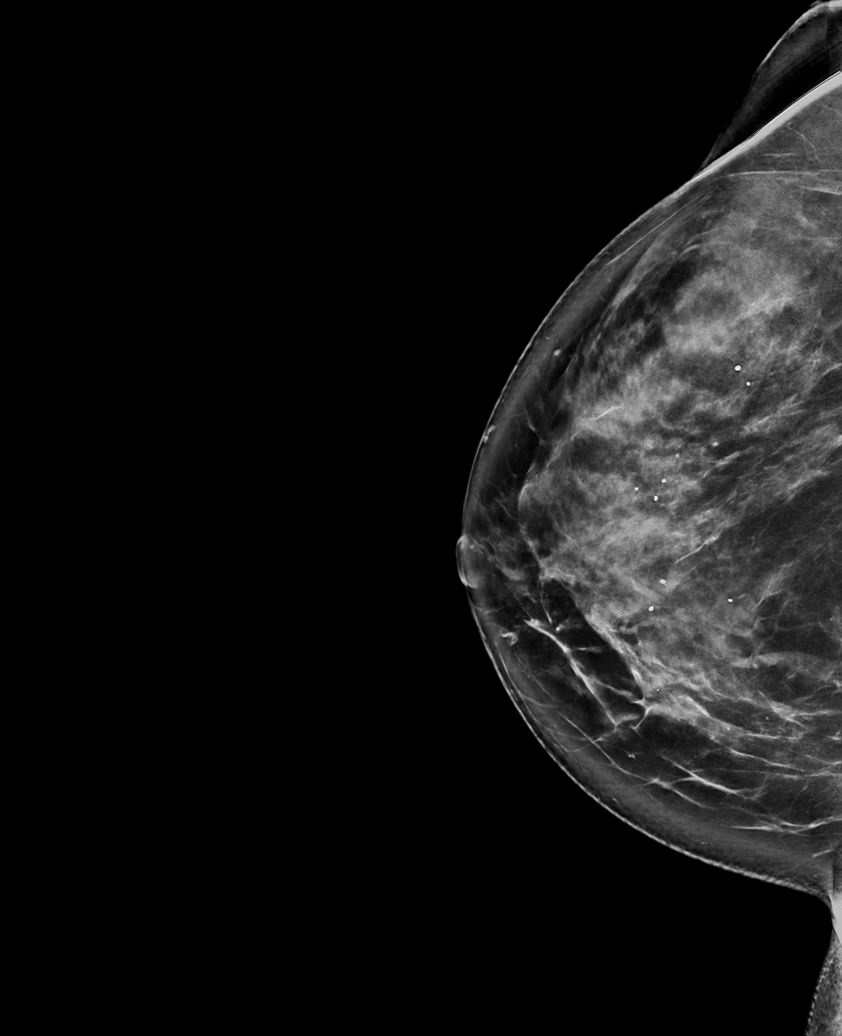

[R XCCL tomo · tomo slice 48/95.0]
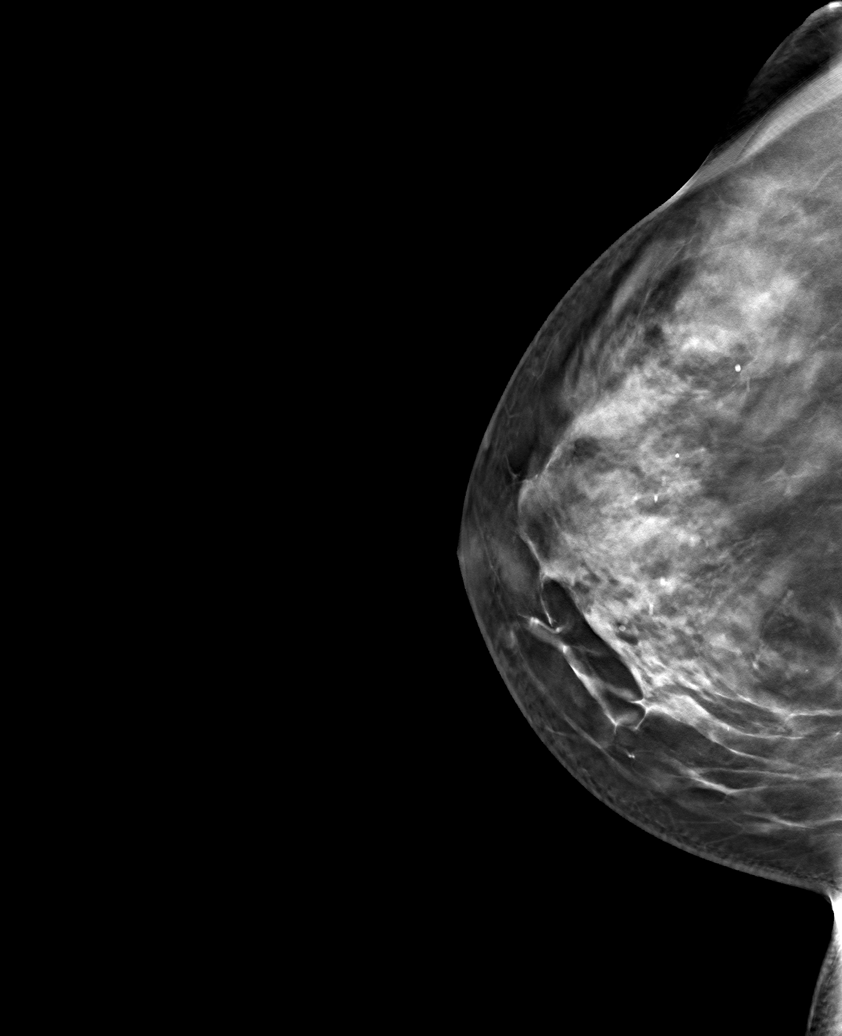

[6 of 30 positions shown; findings below may reference images not displayed]

ACR Breast Density Category c: The breast tissue is heterogeneously
dense, which may obscure small masses.
FINDINGS: There are no findings suspicious for malignancy.
IMPRESSION: No mammographic evidence of malignancy. A result letter of this
screening mammogram will be mailed directly to the patient.

RECOMMENDATION:
Screening mammogram in one year. (Code:Q3-W-BC3)

BI-RADS CATEGORY  1: Negative.

## 2023-11-18 LAB — GLUCOSE, POCT (MANUAL RESULT ENTRY): POC Glucose: 195 mg/dL — AB (ref 70–99)

## 2023-11-18 NOTE — Congregational Nurse Program (Signed)
  Dept: [phone]   Congregational Nurse Program Note  Date of Encounter: 11/18/2023 BP 119/79 (Patient Position: Sitting)   Pulse 66  CBG 195. Pt just drank a Coke. Pt c/o dizziness, nausea that comes and goes. Pt eats lunch and dinner but for breakfast only has a cup of coffee with sugar. Enc pt to eat a little protein in the morning. Pt is in process of getting orange card; when complete, will arrange for an appointment with pcp. Inst pt to return to me if having problems. Pt also has subconjuctival hemorrhage, improving (she showed me a photo taken last week, eye was very red almost covering the sclera; today the redness has decreased substantially). Pt reports no trauma, but she woke up one morning and the hemorrhage was just there.  Landry Slocumb, RN, CCNP   Past Medical History: Past Medical History:  Diagnosis Date   Anxiety    Depression    pp depression   Panic disorder    Varicose veins     Encounter Details:  Community Questionnaire - 11/18/23 1642       Questionnaire   Ask client: Do you give verbal consent for me to treat you today? Yes    Student Assistance N/A    Location Patient Presenter, broadcasting    Encounter Setting CN site    Population Status Migrant/Refugee    Insurance Uninsured ([employer] Card/Care Connects/Self-Pay/Medicaid Family Planning)    Insurance/Financial Assistance Referral [employer] Research officer, trade union    Medication N/A    Medical Provider No    Screening Referrals Made N/A    Medical Referrals Made [facility] PCP[facility]Clinic    Medical Appointment Completed N/A    CNP Interventions Advocate/Support;Educate    Screenings CN Performed Blood Pressure;Blood Glucose    ED Visit Averted N/A    Life-Saving Intervention Made N/A

## 2023-12-02 NOTE — Progress Notes (Signed)
 Subjective  HPI  Here for annual PE.  Last annual was with Dr. Lang 10/09/21.   Chief Complaint  Patient presents with  . Complete Physical Exam    Pt in for physical and labs. PAP was done this yr, due for mammo.  . Dizziness    She expresses headache, dizziness and fatigue come/goes for a mo. She believes this is bc of Mirena BC.  . Immunization    Due for HepB, HPV, TDAP, and flu. Accepts all.   Hep C and HIV were negative in 09/2021. Had negative Mammo 06/02/22.   Spanish Translator present [phone] via propio.  Here for annual but mainly becos she is feeling bad.  Notes she has had her PAP this yr.   Feeling bad: Headache, dizziness. Wonders if her BP is high or low. Skin itches and wonders if she is allergic to something.  Has itchy skin. When does it itch: in her hands she notes but she is itching her forearm. IF the a back pack is touching her skin: then she starts itching.  If her husband touches her with his beard: she will itch Holding grandchild: and having grandchild on her chest: she can start itching. This has been going on for: did not answer this question and went to another problem. Told her she needs to pay attention what she is coming in contact with  2 weeks ago: veins in the eyes: popped. Then felt pulsating of the blood vessle. Pain between eyes. Pain in the ears like when she is laying when laying on that side.  Feels air in there: asked for clarification: outside of ears. Then notes: the other day felt like brain was swelling: not sure what she meant but goes on to another complaint.  Had urinary infection at the time when her eyes were giving the issue as noted above.  She took some medication for her urinary sx and that took care of it.    Pt then brings up palpitations:  that started 1 month ago. Frequency :2 weeks ago : it was q1hr and now less frequent. What was she doing when she fist noticed it: Was sitting down.  Were you stressed out about when  it first happened? : wonders if related to back surgery for scoliosis that her daughter  was scheduled to have. Daughter subsequently had the surgery on 10/19/23. When was the last episode: today at 1 pm: just one she notes: when asked to clarify: meaning one episode.  The other day had 4 episodes, today just one episode.  When you felt it was it beating fast or irregular: neither: it feels like a baby kicking for one second and it stops. No dizziness with it.  Pt jumps to numerous complaints, when asked further questions about the previous complaints  so made this visit very difficult and frustrating plus on top of needing translation.    Current meds:  Tyl prn HA, vitamins, Fish Oil(omega 3)  LMP:  Has IUD and since then no periods. [facility] pt request becos of pain associated with it. [facility] [address] normal. Wondering if all her sx are related to peri-menopause.       Patient Active Problem List   Diagnosis Date Noted  . Cholelithiasis 12/18/2013  . Gastroesophageal reflux 09/17/2013  . VBAC, delivered 06/20/2012    Past Medical History:  Diagnosis Date  . Cholelithiasis 12/18/2013  . Gastroesophageal Reflux 09/17/2013  No past surgical history on file. Chole 51yrs ago: c-section for first daughter. HAS 4 kids. The other 3 were vaginal deliveries.  Tobacco History   Tobacco Use  Smoking Status Never  Smokeless Tobacco Never   Social History   Substance and Sexual Activity  Alcohol Use Not Currently   Comment: ocassionally   Social History   Substance and Sexual Activity  Drug Use Never   Social History   Substance and Sexual Activity  Sexual Activity Yes  . Partners: Male  . Birth control/protection: I.U.D.  Married Work outside of home: none   10/09/2021  3:39 PM  Select Social Determinants of Health Screening [facility] Core  What is your living situation today?  I have a steady place to  live  Number of positive responses to housing questions 0  Within the past 12 months, you worried that your food would run out before you got money to buy more. Never true  Within the past 12 months, the food you bought just didn't last and you didn't have money to get more. Never true  Number of positive responses to food security questions 0  In the past 12 months has the electric, gas, oil, or water company threatened to shut off services in your home? No  How often does anyone, including family and friends, physically hurt you? Never  How often does anyone, including family and friends, insult or talk down to you? Never  How often does anyone, including family and friends, threaten you with harm? Never  How often does anyone, including family and friends, scream or curse at you? Never  Relationship Safety Total Score: >11 is abnormal 4    No family history on file.  Nothing right now she notes. No DM/HTN/CAD/CVA. Cancers of any kind: none.  Review of Systems: per HPI.  Objective   Vitals:   12/02/23 1456  BP: 107/71  Pulse: 73  Temp: 98 F (36.7 C)  TempSrc: Oral  SpO2: 96%  Weight: 194 lb (88 kg)  Height: 5' 1.81 (1.57 m)   Estimated body mass index is 35.7 kg/m as calculated from the following:   Height as of this encounter: 5' 1.81 (1.57 m).   Weight as of this encounter: 194 lb (88 kg). Facility age limit for growth %iles is 20 years.   Physical Exam  Gen: Alert, NAD. Not ill appearing. HEENT: Eyes: PERLA Ears: no external lesions, no d/c in EAC. TMs: normal light reflex.  Nose: normal mucosa. No d/c  OP: No erythema, no exudates. Pndrip noted.  Neck: no thyromegally. No Lymphadenopathy Lungs: CTAB Heart: RRR no murmurs. Back: no spinous tenderness. No CVAT Abdomen: BS-normal, Soft, NT, ND. No HSM UE: FROM. Good grip str. LE: no pretib edema. Pelvic; normal external female genitalia. No vulva lesions. No Vaginal lesions.  Cervix: NP/Parous, no lesions,  no d/c.  Bimanual: Uterus normal size for parity. No adnexal masses.  Rectovaginal: no external lesions. Good rectal tone. no masses. Stool: Hemeoccult: negative. Muscle STR: 5/5 in all groups tested Able to dorsi/plantar flex. DTRs: 2/4 at knee jerks     Assessment and Plan   1. Annual physical exam (Primary) Pt with numerous complaints, whic pt goes from 1 to another when asks clarifications for one complaint. Her main question was really is all these were happening becos of possibly being menopausal. Will usual screening labs as noted below as part of annual.  - BLOOD COUNT COMPLETE AUTO&AUTO DIFRNTL WBC Routine - COMPREHENSIVE METABOLIC PANEL Routine -  LIPID PANEL W/TOT CHOL/HDL RATIO Routine  2. Itching Is describing contact dermatitis though does not have a rash and after she first mentioned it: where she was scratching at her forearms, she stopped scratching when talking about all these other issues.  3. Palpitations Sounds amazingly transient if any. It does not really sound like palpitations but rather: one strong beat or an ectopic beat. - BLOOD COUNT COMPLETE AUTO&AUTO DIFRNTL WBC Routine - COMPREHENSIVE METABOLIC PANEL Routine - TSH + FREE T4 Routine - ASSAY OF MAGNESIUM Routine  4. Encounter for screening for malignant neoplasm of breast, unspecified screening modality  - SCREENING MAMMOGRAPHY BI 2-VIEW BREAST INC CAD  5. Hot flashes That she notes she gets mostly at night. See comments under #1. [facility] AND LH Routine  6. Screening for diabetes mellitus  - HEMOGLOBIN GLYCOSYLATED A1C Routine  7. Immunization due  - HEP B ADULT - 9VHPV VACC 2/3 DOSE SCHED IM USE - TDAP VACCINE 7 YRS/> IM  Pt was advised to come to clinic prn when she is having an issue, instead of collecting her sx too long and them coming for an annual periodically to talk about issues. Advised annual Physicals and prn visit.

## 2023-12-02 NOTE — Progress Notes (Signed)
 SUBJECTIVE: Patient presents for vaccine services:  OBJECTIVE: Vaccinated to prevent communicable disease. Vaccine history reviewed.  ASSESSMENT: Vaccine screening completed.  PLAN: Contraindications reviewed and VIS form/s given today. Consent reviewed and signed by patient/parent. Vaccination record updated. Patient instructed on side effects related to vaccine/s and to seek emergency service should any adverse reactions occur.   Administered:  Immunizations Administered on Date of Encounter - 12/02/2023  Reviewed on 12/02/2023    Name Date   HPV 9 (Gardasil) 12/02/2023   Hep B, Adult/Adol (ENERGIX-B-ADULT/RECOMBIVAX-ADULT) 12/02/2023   TDAP 12/02/2023        Patient tolerated vaccination well.  Patient verbalized understanding of vaccine care and instructions.

## 2023-12-15 NOTE — Telephone Encounter (Signed)
 ATC patient x1. No answer; lvm to return call.
